# Patient Record
Sex: Female | Born: 1960 | Race: White | Marital: Married | Smoking: Former smoker
Health system: Southern US, Community
[De-identification: ages and names within clinical notes are randomized; demographics above are authoritative.]

## PROBLEM LIST (undated history)

## (undated) DIAGNOSIS — E785 Hyperlipidemia, unspecified: Secondary | ICD-10-CM

## (undated) DIAGNOSIS — G935 Compression of brain: Secondary | ICD-10-CM

## (undated) DIAGNOSIS — G4733 Obstructive sleep apnea (adult) (pediatric): Secondary | ICD-10-CM

## (undated) HISTORY — DX: Hyperlipidemia, unspecified: E78.5

## (undated) HISTORY — DX: Obstructive sleep apnea (adult) (pediatric): G47.33

## (undated) HISTORY — DX: Compression of brain: G93.5

---

## 2009-05-01 ENCOUNTER — Ambulatory Visit
Admit: 2009-05-01 | Disposition: A | Discharge status: Home / Self Care | Payer: Self-pay | Source: Ambulatory Visit | Admitting: Gynecologic Oncology

## 2009-05-01 LAB — COMPREHENSIVE METABOLIC PANEL
ALT: 13 U/L (ref 0–55)
AST (SGOT): 13 U/L (ref 5–34)
Albumin/Globulin Ratio: 1.1 (ref 0.9–2.2)
Albumin: 3.6 g/dL (ref 3.5–5.0)
Alkaline Phosphatase: 59 U/L (ref 40–150)
BUN: 14 mg/dL (ref 7.0–19.0)
Bilirubin, Total: 0.4 mg/dL (ref 0.2–1.2)
CO2: 17 mEq/L — ABNORMAL LOW (ref 22–29)
Calcium: 9.3 mg/dL (ref 8.5–10.5)
Chloride: 112 mEq/L — ABNORMAL HIGH (ref 98–107)
Creatinine: 0.7 mg/dL (ref 0.6–1.0)
Globulin: 3.3 g/dL (ref 2.0–3.6)
Glucose: 82 mg/dL (ref 70–100)
Potassium: 4.2 mEq/L (ref 3.5–5.1)
Protein, Total: 6.9 g/dL (ref 6.0–8.3)
Sodium: 141 mEq/L (ref 136–145)

## 2009-05-01 LAB — CBC
Hematocrit: 42.7 % (ref 37.0–47.0)
Hgb: 14 g/dL (ref 12.0–16.0)
MCH: 28.9 pg (ref 28.0–32.0)
MCHC: 32.8 g/dL (ref 32.0–36.0)
MCV: 88.2 fL (ref 80.0–100.0)
MPV: 11.4 fL (ref 9.4–12.3)
Platelets: 351 10*3/uL (ref 140–400)
RBC: 4.84 10*6/uL (ref 4.20–5.40)
RDW: 13 % (ref 12–15)
WBC: 8.59 10*3/uL (ref 3.50–10.80)

## 2009-05-01 LAB — PT AND APTT F/C
PT INR: 1 (ref 0.9–1.1)
PT: 13.9 s (ref 12.6–15.0)
PTT: 27 s (ref 23–37)

## 2009-05-01 LAB — TYPE AND SCREEN
AB Screen Gel: NEGATIVE
ABO Rh: B POS

## 2009-05-01 LAB — HEMOLYSIS INDEX: Hemolysis Index: 5

## 2009-05-01 LAB — GFR: EGFR: >60

## 2009-05-01 LAB — HCG QUANTITATIVE: hCG, Quant.: <1.2 m[IU]/mL (ref 0.0–4.9)

## 2009-05-02 ENCOUNTER — Ambulatory Visit: Payer: Self-pay

## 2009-05-02 ENCOUNTER — Ambulatory Visit (HOSPITAL_BASED_OUTPATIENT_CLINIC_OR_DEPARTMENT_OTHER)
Admission: RE | Admit: 2009-05-02 | Disposition: A | Discharge status: Home / Self Care | Payer: Self-pay | Source: Ambulatory Visit | Admitting: Gynecologic Oncology

## 2009-05-03 LAB — CBC
Hematocrit: 33.5 % — ABNORMAL LOW (ref 37.0–47.0)
Hgb: 11.2 g/dL — ABNORMAL LOW (ref 12.0–16.0)
MCH: 29.3 pg (ref 28.0–32.0)
MCHC: 33.4 g/dL (ref 32.0–36.0)
MCV: 87.7 fL (ref 80.0–100.0)
MPV: 10.8 fL (ref 9.4–12.3)
Platelets: 264 10*3/uL (ref 140–400)
RBC: 3.82 10*6/uL — ABNORMAL LOW (ref 4.20–5.40)
RDW: 14 % (ref 12–15)
WBC: 11.28 10*3/uL — ABNORMAL HIGH (ref 3.50–10.80)

## 2009-05-05 LAB — LAB USE ONLY - HISTORICAL SURGICAL PATHOLOGY

## 2009-05-07 LAB — LAB USE ONLY - HISTORICAL NON-GYN MEDICAL CYTOLOGY

## 2009-05-09 LAB — PREPARE RBC

## 2010-11-21 LAB — ECG 12-LEAD
Atrial Rate: 68 {beats}/min
P Axis: 42 degrees
P-R Interval: 126 ms
Q-T Interval: 372 ms
QRS Duration: 92 ms
QTC Calculation (Bezet): 395 ms
R Axis: 16 degrees
T Axis: 51 degrees
Ventricular Rate: 68 {beats}/min

## 2010-12-25 NOTE — Op Note (Unsigned)
Linda Savage, Linda Savage      MRN:          [id number]      Account:      [id number]      Document ID:  [id number]      Procedure Date: 05/02/2009            Admit Date: 05/02/2009            Patient Location: DISCHARGED 05/03/2009      Patient Type: A            SURGEON: Parke Poisson MD      ASSISTANT:                  PREOPERATIVE DIAGNOSIS:      Complex adnexal mass with elevated CA-125 in a perimenopausal patient.            POSTOPERATIVE DIAGNOSES:      Endometriosis with pelvic and abdominal adhesions with arcuate uterus and      bilateral normal ovaries and tubes.            TITLE OF PROCEDURE:      Exam under anesthesia, laparoscopic extensive lysis of adhesion, total      laparoscopic hysterectomy with bilateral salpingo-oophorectomy of over a      250-gram uterus.            ANESTHESIA:      General endotracheal.            INTRAVENOUS FLUIDS:      4000 mL.            URINE OUTPUT:      200 mL.            ESTIMATED BLOOD LOSS:      50 mL.            INDICATIONS:      Complex adnexal mass with elevated CA-125 in a perimenopausal patient.            FINDINGS:      Dense adhesions between the omentum and the anterior abdominal wall as well      as the uterus and the posterior cul-de-sac with extensive endometriosis and      adhesions between the adnexa and the pelvic sidewalls.  Adhesions between      the bladder and the anterior lower uterine segment as well with an arcuate      uterus.            DESCRIPTION OF PROCEDURE:      After noting appropriate consent and giving antibiotics, the patient was                                   Page 1 of 3      CAMIYA, VINAL      MRN:          [id number]      Account:      [id number]      Document ID:  [id number]      Procedure Date: 05/02/2009            taken to the operating room, placed in dorsal lithotomy position.  SCDs      were started prior to anesthesia administration.  Exam under anesthesia was      performed noting pelvic fullness.  She was then prepped and draped in  the  usual sterile fashion.  A Foley catheter and RUMI KOH uterine manipulator      was inserted in the usual manner.  This was done after the uterus was      sounded to 8 cm.  The attention was now turned to the abdomen where an      infraumbilical 5-mm incision was made with a scalpel and 5-mm Optiview with      camera were inserted.  Intraperitoneal access was confirmed and adhesions      were noted.  Now, under direct visualization, bilateral lower quadrant 5-mm      ports were inserted.  Now, the adhesions were taken down sharply as well as      with the cautery and LigaSure and bowel was noted to be intact at all      times, and hemostasis was achieved on the omentum.  Now, washings were      obtained and sent off for cytology.  Trendelenburg was obtained and bowel      was moved out of the pelvis, and above-mentioned findings were noted.  The      attention was first turned to the left side and the rectosigmoid was      dissected off the pelvic sidewall to gain more access to the left tube and      ovary.  The left round ligament was transected and the retroperitoneal      access was gained.  The left ureter was identified.  The left IP was      isolated, multiply cauterized and transected.  The vesicouterine peritoneum      was dissected and the bladder was dissected from the left side.  The      attention was turned to the right side, and the right round ligament was      transected.  The retroperitoneal access was gained.  The right ureter was      identified and the right IP was isolated, multiply cauterized, and      transected.  The vesicouterine peritoneum was dissected on the right side.      It was densely adherent.  The bladder was densely adherent and this was      taken sharply with the cautery, and the bladder was now completely      dissected off.  Bilateral uterine arteries were skeletonized, multiply      cauterized and transected.  It was noted that the uterus was densely      adherent in  the posterior cul-de-sac and these adhesions were taken sharply      while having good visualization of the rectosigmoid at all times and the      ureters.  This elevated the uterus a little bit and the cardinal ligaments      were sequentially cauterized and transected as well.  Colpotomy was      performed and specimen was removed from the vagina.  The vaginal cuff was      closed with multiple figure-of-eight sutures with 0 Vicryl stitch.  Now,      the attention was turned back to the abdomen and copious irrigation was      performed, and everything was noted to be hemostatic.  There was      yellowish-brownish material in the cul-de-sac that looked like old      endometriosis; however, rectal bubble test was confirmed along with an EEA      sizer protrusion into the rectum to confirm the  integrity of the rectum,      which was confirmed.  Now, once again, copious irrigation was performed and      all instruments removed.  The skin was closed in a subcuticular fashion      with 4-0 stitch.  There were no complications.  Marcaine 0.25% without      epinephrine was administered at all port sites.  As mentioned, all counts      were correct and there were no complications during the surgery, and I was      present and scrubbed through the entire procedure.  As the patient was                                   Page 2 of 3      MYRIKAL, MESSMER      MRN:          [id number]      Account:      [id number]      Document ID:  [id number]      Procedure Date: 05/02/2009            extubated, she had a live laryngospasm opposite and had to be reintubated      prior to taking to the recovery room and was taken to the recovery room in      an intubated fashion.  Please note that the intraoperative pathology was      benign.                        Electronic Signing Provider            D:  05/02/2009 13:02 PM by Dr. Parke Poisson, MD ([id number])      T:  05/02/2009 18:18 PM by [id number]                  [id number] Eben Burow MD                                    Page 3 of 3      Authenticated by Parke Poisson, MD On 05/07/2009 10:49:34 AM

## 2015-09-02 ENCOUNTER — Ambulatory Visit: Payer: BC Managed Care – PPO

## 2015-09-04 ENCOUNTER — Ambulatory Visit
Admission: RE | Admit: 2015-09-04 | Payer: BC Managed Care – PPO | Source: Ambulatory Visit | Admitting: Orthopaedic Surgery

## 2015-09-04 ENCOUNTER — Encounter: Admission: RE | Payer: Self-pay | Source: Ambulatory Visit

## 2015-09-04 SURGERY — REPAIR, HAND, NERVE
Anesthesia: General | Site: Hand | Laterality: Right

## 2016-06-30 ENCOUNTER — Ambulatory Visit
Admission: RE | Admit: 2016-06-30 | Discharge: 2016-06-30 | Disposition: A | Discharge status: Home / Self Care | Payer: BC Managed Care – PPO | Source: Ambulatory Visit | Attending: Cardiovascular Disease | Admitting: Cardiovascular Disease

## 2016-06-30 ENCOUNTER — Encounter (INDEPENDENT_AMBULATORY_CARE_PROVIDER_SITE_OTHER): Payer: Self-pay | Admitting: Cardiovascular Disease

## 2016-06-30 ENCOUNTER — Ambulatory Visit (INDEPENDENT_AMBULATORY_CARE_PROVIDER_SITE_OTHER): Payer: BC Managed Care – PPO | Admitting: Cardiovascular Disease

## 2016-06-30 VITALS — BP 120/89 | HR 128 | Ht 63.0 in | Wt 184.0 lb

## 2016-06-30 DIAGNOSIS — R0609 Other forms of dyspnea: Secondary | ICD-10-CM

## 2016-06-30 DIAGNOSIS — R000 Tachycardia, unspecified: Secondary | ICD-10-CM

## 2016-06-30 LAB — T4, FREE: T4 Free: 0.97 ng/dL (ref 0.70–1.48)

## 2016-06-30 LAB — TSH: TSH: 1.39 u[IU]/mL (ref 0.35–4.94)

## 2016-06-30 NOTE — Progress Notes (Signed)
Cardiology Consult note  Cardiology Consult    Date/Time:  06/30/2016 11:06 AM  Patient Name:  Linda Savage  DoB:  1960-03-22  MRN:  [id number]  Room#: Room/bed info not found    Chief Complaint:    Chief Complaint   Patient presents with   . Initial Consult   . Tachycardia       Reason For Consult:     tachycardia    HPI :   56 yo with tachycardia. Fitbit reads high HR. Can be as high as 120. On going for 12 months.  Can feel jittery. Rare fluttering. SOB climbing stairs Is no worse lately. No chest pain. No edema. No syncope.   Gained weight over several years. No sweats or flushing. Drinks one cup of coffee daily.    PMHx:     Past Medical History:   Diagnosis Date   . Chiari malformation type I    . Hyperlipidemia    . OSA (obstructive sleep apnea)        Allergies:     Allergies   Allergen Reactions   . Amoxicillin-Pot Clavulanate Nausea And Vomiting   . Tramadol Itching       Home Medications:     No current outpatient prescriptions on file prior to visit.     No current facility-administered medications on file prior to visit.          SoHx:     Social History   Substance Use Topics   . Smoking status: Former Smoker     Quit date: 04/11/2008   . Smokeless tobacco: Never Used   . Alcohol use Yes      Comment: socially 2 times a week       Surgical Hx:     Past Surgical History:   Procedure Laterality Date   . CESAREAN SECTION  1985   . FINGER SURGERY  2017   . HYSTERECTOMY  2011       Family Hx:     Dad died of complications in surgery  No premature CAD in first degree relatives    Review of Systems:   Constitutional: Negative for fevers and chills  Skin: No rash or lesions  Respiratory: Negative for hemoptysis  Cardiovascular: as per HPI  Gastrointestinal: Negative for hematochezia  Musculoskeletal:  No myalgias  Genitourinary: Negative for hematuria  Neurologic: headaches  Otherwise 10 point review of systems is negative.      Physical Exam:   BP 120/89 (BP Site: Left arm, Patient Position: Sitting, Cuff Size:  Large)   Pulse (!) 128   Ht 1.6 m (5\' 3" )   Wt 83.5 kg (184 lb)   BMI 32.59 kg/m     SpO2 on              Constitutional:      Alert, cooperative, well developed, well nourished.  No acute distress.   Skin:     Warm and dry to touch.  No apparent rashes or bruising.   Head/Eyes :    Normocephalic.  EOM intact.  Normal conjunctivae and lids.       Neck:   Carotid pulses full and equal bilaterally.  No carotid bruit. JVP to 5 cm.   Lungs:     No tenderness to palpation.  Normal respiratory effort.  Clear to auscultation bilaterally.  No rhonchi, rales, or wheezes.     Cardiac:   LV apical impulse not displaced.  Regular rhythm.   S1 and  S2 normal.  No murmur, rub, or gallop   Abdomen:     Soft, non-tender.  Bowel sounds present.  No bruits.   Extremities:   No clubbing, cyanosis, or edema.   Pulses:   Distal pulses are full and equal bilaterally.     Neurologic:   No gross motor or sensory deficits.  Alert O x 3.   Psychiatric:   Normal mood and affect.     Labs:     No visits with results within 2 Day(s) from this visit.   Latest known visit with results is:   Hospital Outpatient Visit on 05/02/2009   Component Date Value Ref Range Status   . ABO Rh 05/01/2009 B POS   Final   . AB Screen Gel 05/01/2009 NEG   Final   . RBC Leukoreduced 05/01/2009 see text   Final   . WBC 05/03/2009 11.28* 3.50 - 10.80 x10 3/uL Final   . RBC 05/03/2009 3.82* 4.20 - 5.40 x10 6/uL Final   . Hgb 05/03/2009 11.2* 12.0 - 16.0 g/dL Final   . Hematocrit 16/12/9602 33.5* 37.0 - 47.0 % Final   . MCV 05/03/2009 87.7  80.0 - 100.0 fL Final   . MCH 05/03/2009 29.3  28.0 - 32.0 pg Final   . MCHC 05/03/2009 33.4  32.0 - 36.0 g/dL Final   . RDW 54/11/8117 14  12 - 15 % Final   . MPV 05/03/2009 10.8  9.4 - 12.3 fL Final   . Platelets 05/03/2009 264  140 - 400 x10 3/uL Final       Radiology:     ECG independently reviewed by me shows dinud tach at 115 bpm    Assessment/Plan:     [name] is a 56 y.o. female who presents for cardiac  evaluation of tachycardia. ECG here today shows sinus tachycardia. Will have her wear a holter monitor to evaluate for arrhythmias and HR variability. Check TSH and metanephrines. Given dyspnea, will also obtain an echocardiogram. Tachycardia may also be contributing to dyspnea with exertion.    Thank you for the consultation. Please feel free to contact me with any questions that may arise regarding this visit.

## 2016-07-03 LAB — METANEPHRINES, FRACTIONATED, FREE, PL
Metanephrine, Free: 48 pg/mL (ref <=57)
Normetanephrine, Free: 104 pg/mL (ref <=148)
Total, Free (MN + NMN): 152 pg/mL (ref <=205)

## 2016-07-05 ENCOUNTER — Encounter (INDEPENDENT_AMBULATORY_CARE_PROVIDER_SITE_OTHER): Payer: Self-pay

## 2016-07-07 ENCOUNTER — Ambulatory Visit (INDEPENDENT_AMBULATORY_CARE_PROVIDER_SITE_OTHER): Payer: BC Managed Care – PPO

## 2016-07-07 DIAGNOSIS — R0609 Other forms of dyspnea: Secondary | ICD-10-CM

## 2016-07-07 DIAGNOSIS — R000 Tachycardia, unspecified: Secondary | ICD-10-CM

## 2016-07-12 ENCOUNTER — Ambulatory Visit (INDEPENDENT_AMBULATORY_CARE_PROVIDER_SITE_OTHER): Payer: BC Managed Care – PPO | Admitting: Cardiovascular Disease

## 2016-07-12 DIAGNOSIS — R001 Bradycardia, unspecified: Secondary | ICD-10-CM

## 2016-07-12 DIAGNOSIS — R000 Tachycardia, unspecified: Secondary | ICD-10-CM

## 2016-07-12 NOTE — Procedures (Signed)
HOLTER REPORT    [facility] - [facility]  Tel [phone]      PATIENT:  Linda Savage         MRN: [id number].  Gender: female.  DOB: 14-Jan-1961.  Age: 56 y.o.  Date of study:  06/30/2016    Ordering Physician:  Leamon Arnt, MD  Primary Physician:  Estella Husk, DO  Primary Cardiologist:  Leamon Arnt, MD    INDICATION:    1. Sinus tachycardia          DATA:  Test of hookup:  06/30/2016  Recording time:  23 hours  Quality:  good   Tech comments:       Heart Rate Data     Total beats: 140088   Min HR: 65   Avg HR: 97   Max HR: 162     Pauses > 2.5 sec: 0        Longest: 0 sec  Ventricular Ectopy     Total VE beats: 0 (0.1%)   Vent runs: 0 events          Longest: 0 beats          Fastest: 0 bpm   Triplets: 0 events   Couplets: 0 events   Supraventricular Ectopy     Total SVE beats: 3 (0.1%)   Atrial runs: 0 events          Longest: 0 beats          Fastest: 0 bpm   Atrial pairs: 0 events     FINDINGS:  Baseline rhythm:  Sinus rhythm    Arrhythmia:  Sinus rhythm and sinus tachycardia with rare PVCs    Symptoms reported:  none  Symptom correlation with arrhythmia:  n/a    IMPRESSION:  Sinus rhythm and sinus tachycardia with rare PVCs      Interpreted and electronically signed by:  Dr. Marcia Brash [facility], [facility] - [place] - Norberto Sorenson - Faythe Dingwall

## 2016-07-13 ENCOUNTER — Encounter (INDEPENDENT_AMBULATORY_CARE_PROVIDER_SITE_OTHER): Payer: Self-pay | Admitting: Cardiovascular Disease

## 2016-07-14 ENCOUNTER — Telehealth (INDEPENDENT_AMBULATORY_CARE_PROVIDER_SITE_OTHER): Payer: Self-pay | Admitting: Internal Medicine

## 2016-07-15 ENCOUNTER — Encounter (INDEPENDENT_AMBULATORY_CARE_PROVIDER_SITE_OTHER): Payer: Self-pay | Admitting: Cardiovascular Disease

## 2016-07-27 ENCOUNTER — Encounter (INDEPENDENT_AMBULATORY_CARE_PROVIDER_SITE_OTHER): Payer: Self-pay | Admitting: Cardiovascular Disease

## 2016-07-27 ENCOUNTER — Ambulatory Visit (INDEPENDENT_AMBULATORY_CARE_PROVIDER_SITE_OTHER): Payer: BC Managed Care – PPO | Admitting: Cardiovascular Disease

## 2016-07-27 VITALS — BP 139/94 | HR 72 | Ht 63.0 in | Wt 180.0 lb

## 2016-07-27 DIAGNOSIS — R000 Tachycardia, unspecified: Secondary | ICD-10-CM

## 2016-07-27 DIAGNOSIS — E785 Hyperlipidemia, unspecified: Secondary | ICD-10-CM

## 2016-07-27 MED ORDER — ROSUVASTATIN CALCIUM 5 MG PO TABS
5.0000 mg | ORAL_TABLET | Freq: Every day | ORAL | 1 refills | Status: DC
Start: 2016-07-27 — End: 2017-05-23

## 2016-07-27 MED ORDER — OMEPRAZOLE 20 MG PO CPDR
20.0000 mg | DELAYED_RELEASE_CAPSULE | Freq: Every day | ORAL | 1 refills | Status: AC
Start: 2016-07-27 — End: ?

## 2016-07-27 NOTE — Progress Notes (Signed)
[facility] - [facility]     Chief Complaint   Patient presents with   . Tachycardia     here to discuss recent echo, holter, and lab work.          HPI:     Linda Savage 56 y.o. female presents to the office for a follow up evaluation of tachycardia  Rare palpitations for no more than a few seconds. Feels jittery and HR can be elevated.    Past Medical History     Past Medical History:   Diagnosis Date   . Chiari malformation type I    . Hyperlipidemia    . OSA (obstructive sleep apnea)        Past Surgical History     Past Surgical History:   Procedure Laterality Date   . CESAREAN SECTION  1985   . FINGER SURGERY  2017   . HYSTERECTOMY  2011       Family History     History reviewed. No pertinent family history.    Social History     Social History     Social History   . Marital status: Married     Spouse name: N/A   . Number of children: N/A   . Years of education: N/A     Occupational History   . Not on file.     Social History Main Topics   . Smoking status: Former Smoker     Quit date: 04/11/2008   . Smokeless tobacco: Never Used   . Alcohol use Yes      Comment: socially 2 times a week   . Drug use: No   . Sexual activity: Not on file     Other Topics Concern   . Not on file     Social History Narrative   . No narrative on file       Allergies     Allergies   Allergen Reactions   . Amoxicillin-Pot Clavulanate Nausea And Vomiting   . Tramadol Itching       Medications     Current Outpatient Prescriptions on File Prior to Visit   Medication Sig Dispense Refill   . diphenhydrAMINE (BENADRYL) 25 mg capsule Take 25 mg by mouth nightly.     Marland Kitchen HYDROcodone-acetaminophen (NORCO 10-325) 10-325 MG per tablet Take 1 tablet by mouth as needed.     Marland Kitchen omeprazole (PRILOSEC) 20 MG capsule Take 20 mg by mouth daily.     . rosuvastatin (CRESTOR) 5 MG tablet Take 5 mg by mouth daily.       No current facility-administered medications on file prior to visit.        Review of Systems     Constitutional: Negative for fevers  and chills  Respiratory: Negative for hemoptysis  Cardiovascular: as per HPI  Gastrointestinal: Negative for hematochezia    Physical Exam     Vitals:    07/27/16 1144   BP: (!) 139/94   Pulse: 72     BP 120/80    Body mass index is 31.89 kg/m.    General:  Patient appears their stated age, well-nourished.  Alert and in no apparent distress.  Eyes: No conjunctivitis, no purulent discharge, no lid lag  ENT:  Hearing grossly intact, Nares patent bilaterally, Lips moist, color appropriate for race.  Respiratory: Clear to auscultation. Respiratory effort unlabored, chest expansion symmetric.    Neck: brisk carotid upstrokes, no carotid bruits, JVP to 5 cm  Cardio:  regular rate and rhythm. s1 and s2, no gallops.  Extremities warm, pulses 2+, no peripheral edema  Psychiatric: Good insight and judgment, oriented to person, place, and time    Labs     CBC:     CMP:     Lipid Panel No results found for: CHOL, TRIG, HDL      EKG     Not performed    Assessment and Plan     Linda Savage is a 56 y.o. female who presents for ongoing evaluation of tachycardia. Holter monitor with sinus rhythm and sinus tachycardia without significant arrhythmias. TSH and metanephrines normal. Echocardiogram with normal LV function. Consider beta blocker if worsening symptoms or tachycardia.    Please feel free to contact me should any questions arise regarding this visit.

## 2017-05-14 IMAGING — MG MAMMOGRAPHY SCREENING BILATERAL 3D TOMOSYNTHESIS WITH CAD
8 series · 8 of 24 positions shown · non-contrast
Comparison: 02/25/2016. 
BREAST DENSITY: (Level A) The breasts are almost entirely fatty.

MAMMOGRAPHY SCREENING BILATERAL 3D TOMOSYNTHESIS WITH CAD, 05/14/2017 [DATE]: 
CLINICAL INDICATION: Screening.
TECHNIQUE: Digital bilateral mammograms and 3-D Tomosynthesis were obtained. 
These were interpreted both primarily and with the aid of computer-aided 
detection system.

[R CC]
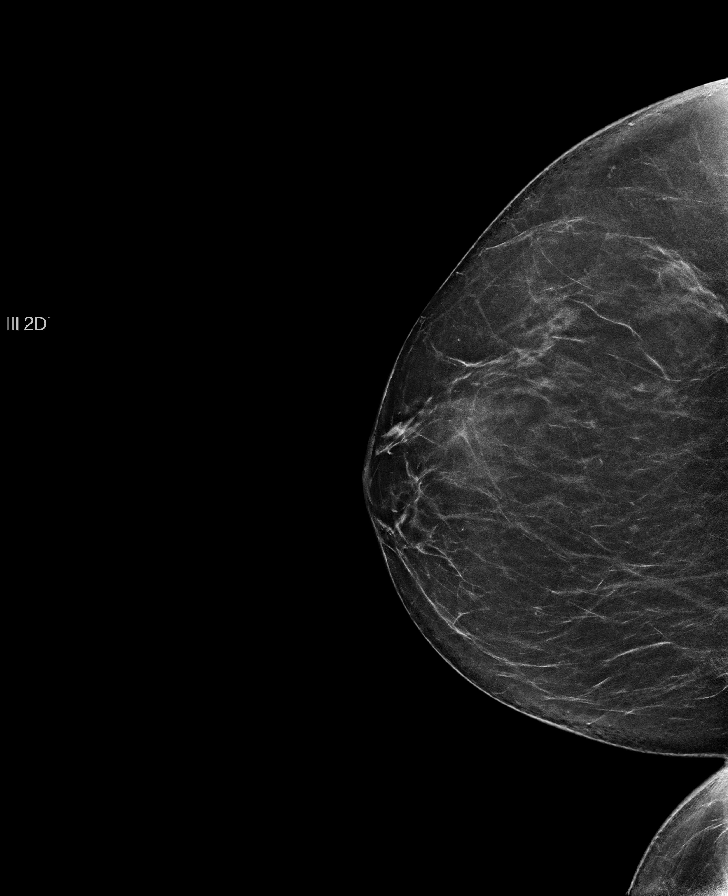

[L CC]
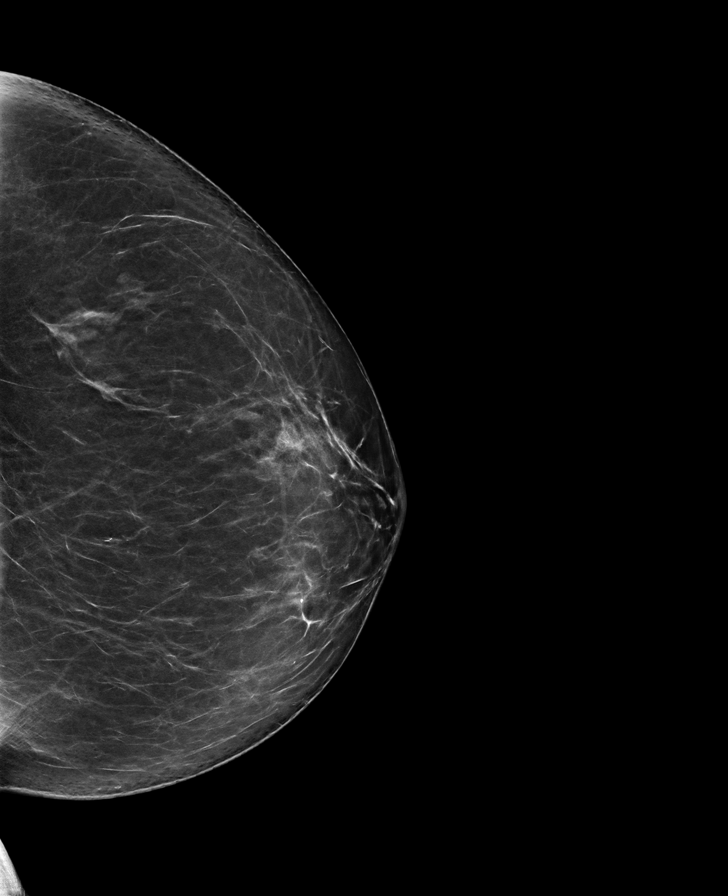

[L MLO]
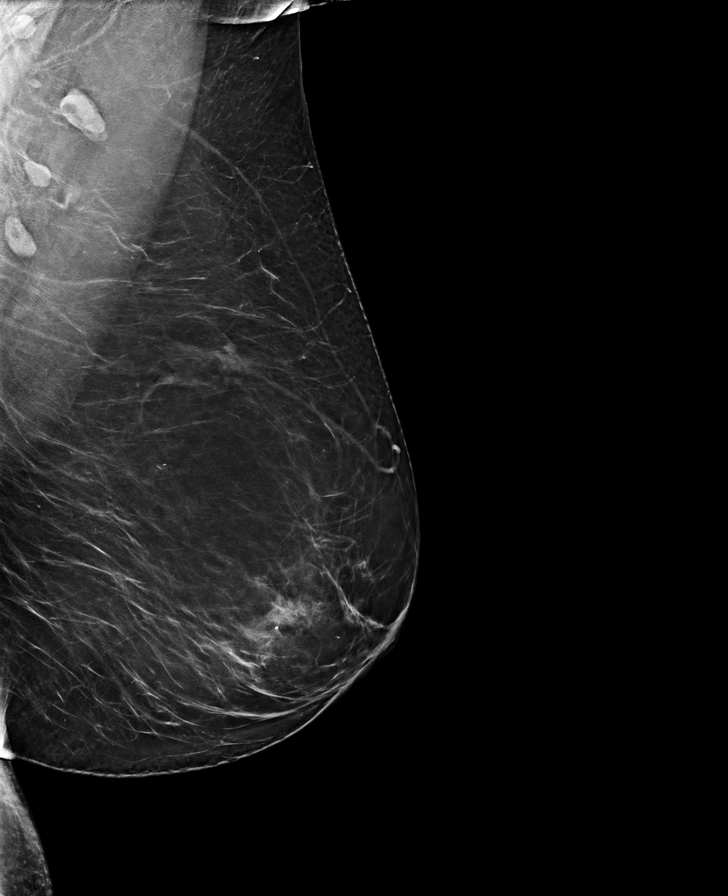

[R MLO]
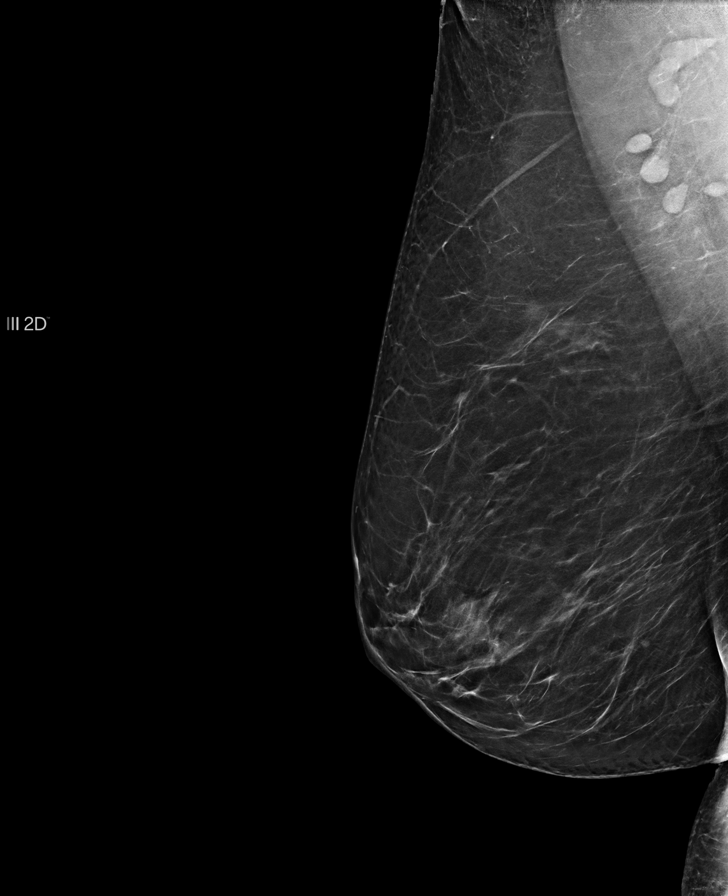

[L MLO tomo · tomo slice 43/86.0]
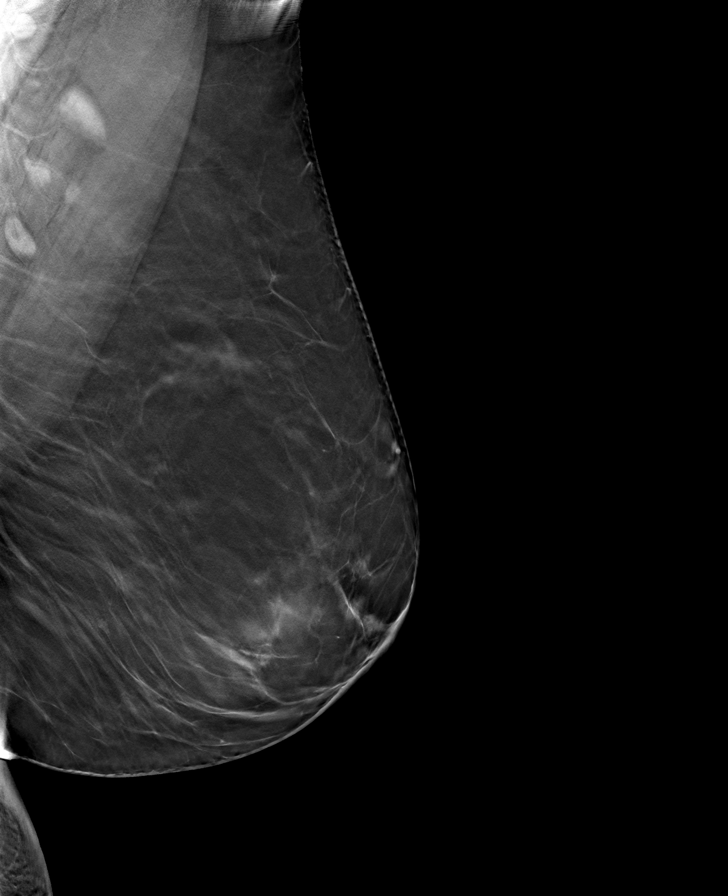

[L CC tomo · tomo slice 35/69.0]
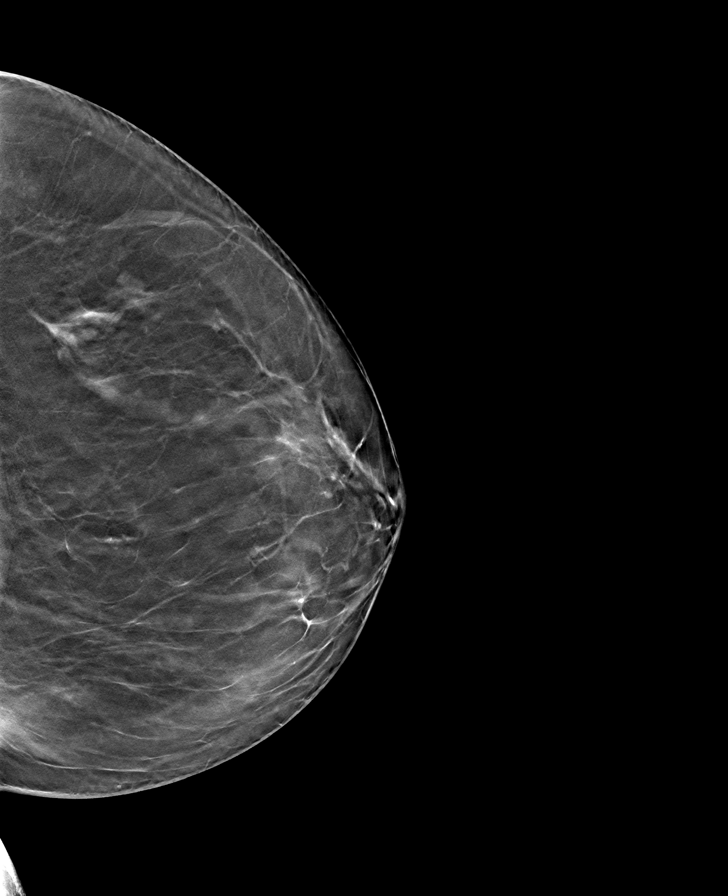

[R MLO tomo · tomo slice 41/81.0]
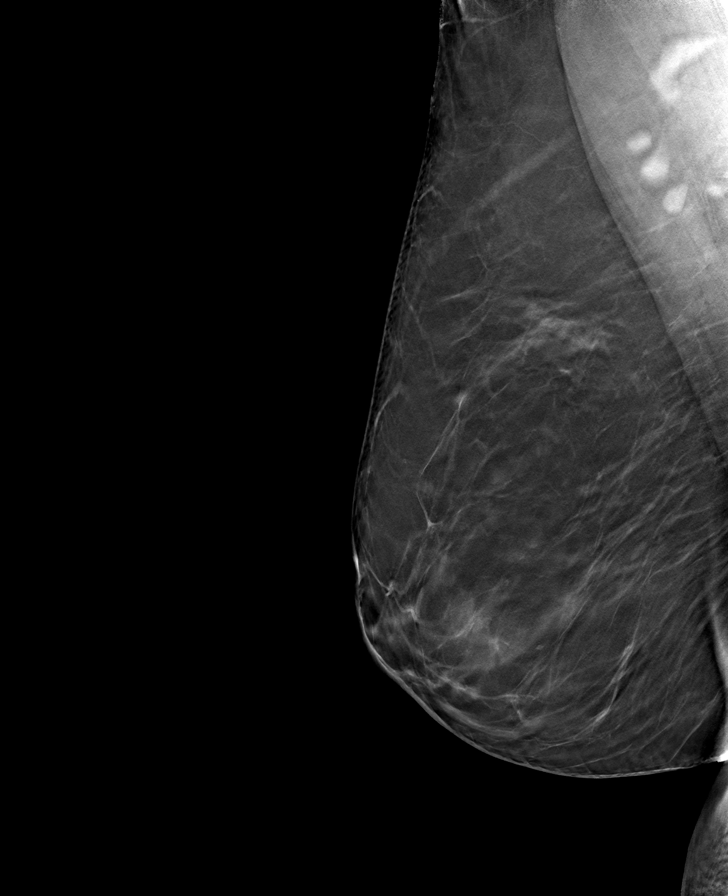

[R CC tomo · tomo slice 35/70.0]
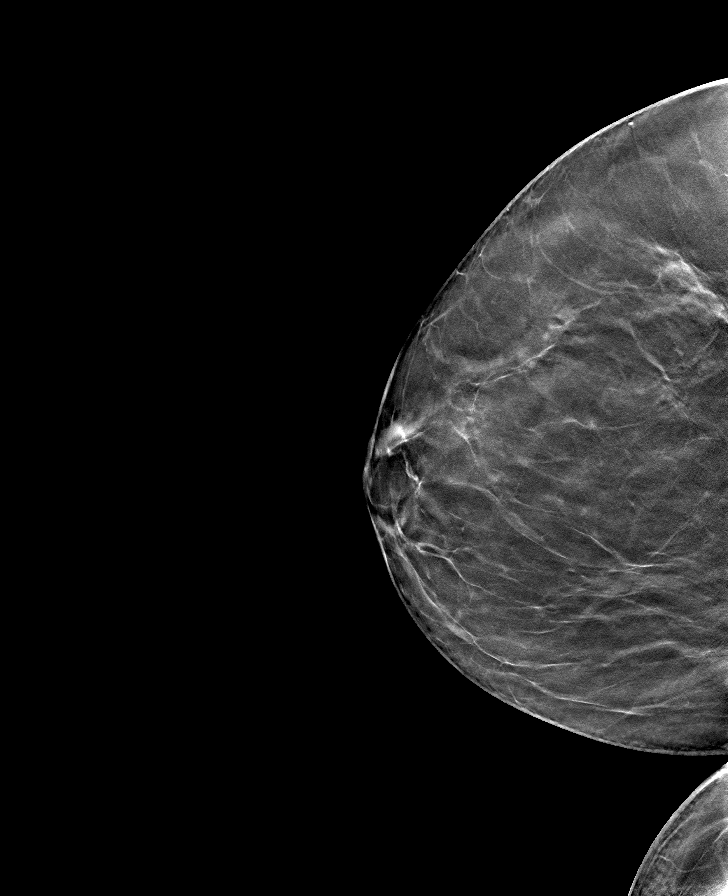

[8 of 24 positions shown; findings below may reference images not displayed]

FINDINGS: No mammographically suspicious abnormality and no significant 
change.
IMPRESSION: ( BI-RADS 2) Benign findings. Routine mammographic follow-up is recommended.

## 2017-05-23 ENCOUNTER — Other Ambulatory Visit (INDEPENDENT_AMBULATORY_CARE_PROVIDER_SITE_OTHER): Payer: Self-pay | Admitting: Cardiovascular Disease

## 2017-05-23 DIAGNOSIS — E785 Hyperlipidemia, unspecified: Secondary | ICD-10-CM

## 2017-08-27 ENCOUNTER — Other Ambulatory Visit (INDEPENDENT_AMBULATORY_CARE_PROVIDER_SITE_OTHER): Payer: Self-pay | Admitting: Cardiovascular Disease

## 2017-08-27 DIAGNOSIS — E785 Hyperlipidemia, unspecified: Secondary | ICD-10-CM

## 2018-03-18 ENCOUNTER — Other Ambulatory Visit (INDEPENDENT_AMBULATORY_CARE_PROVIDER_SITE_OTHER): Payer: Self-pay | Admitting: Cardiovascular Disease

## 2018-03-18 DIAGNOSIS — E785 Hyperlipidemia, unspecified: Secondary | ICD-10-CM

## 2018-08-17 IMAGING — MG MAMMOGRAPHY SCREENING BILATERAL 3D TOMOSYNTHESIS WITH CAD
8 series · 8 of 24 positions shown · non-contrast
Comparison: 05/14/2017 and 02/25/2016. 
BREAST DENSITY: (Level A) The breasts are almost entirely fatty.

MAMMOGRAPHY SCREENING BILATERAL 3D TOMOSYNTHESIS WITH CAD, 08/17/2018 [DATE]: 
CLINICAL INDICATION: Screening.
TECHNIQUE: Digital bilateral mammograms and 3-D Tomosynthesis were obtained. 
These were interpreted both primarily and with the aid of computer-aided 
detection system.

[R CC]
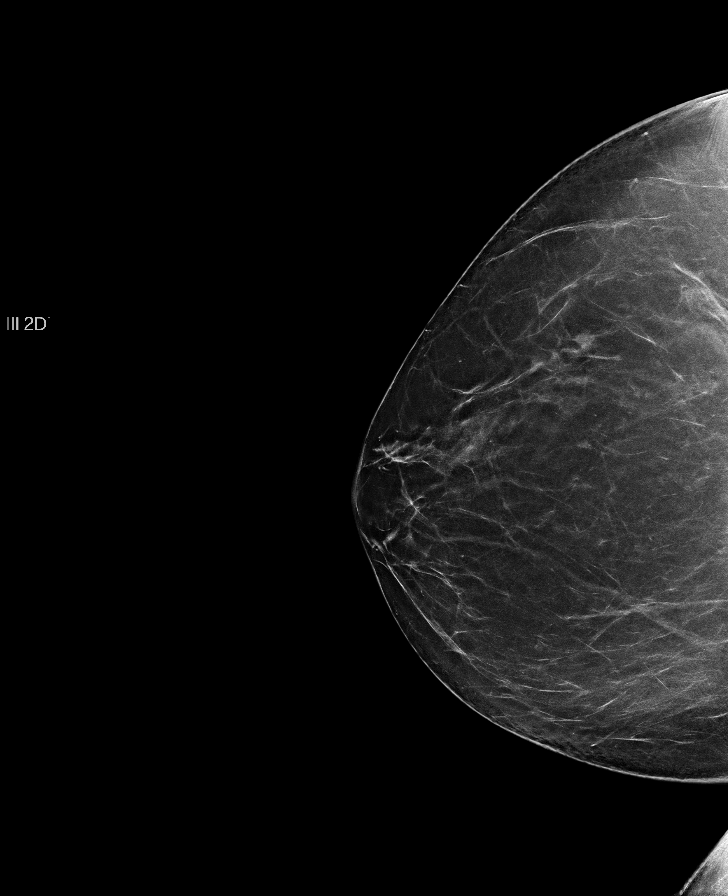

[L CC]
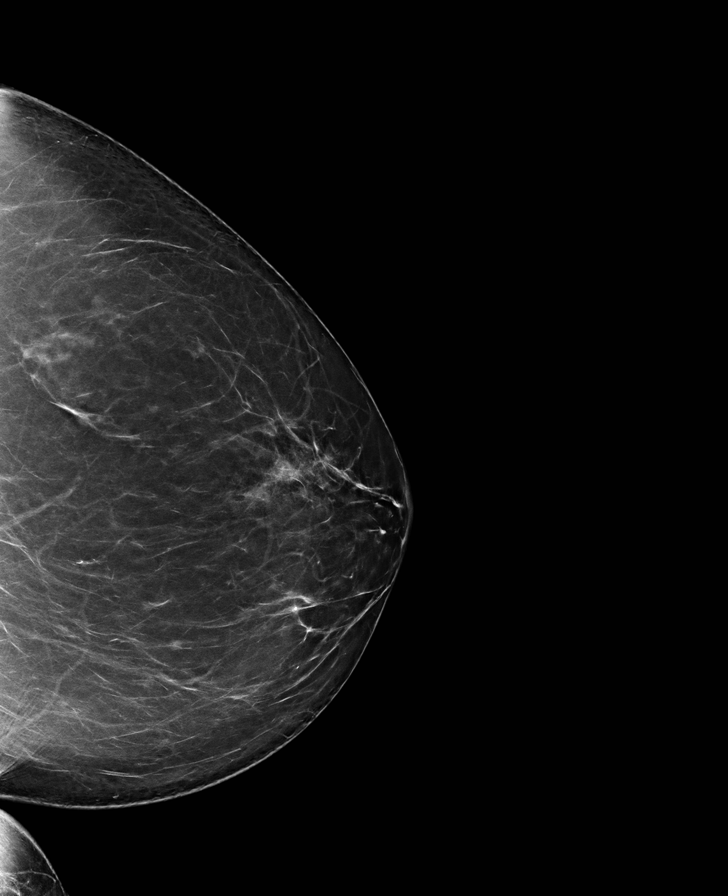

[L MLO]
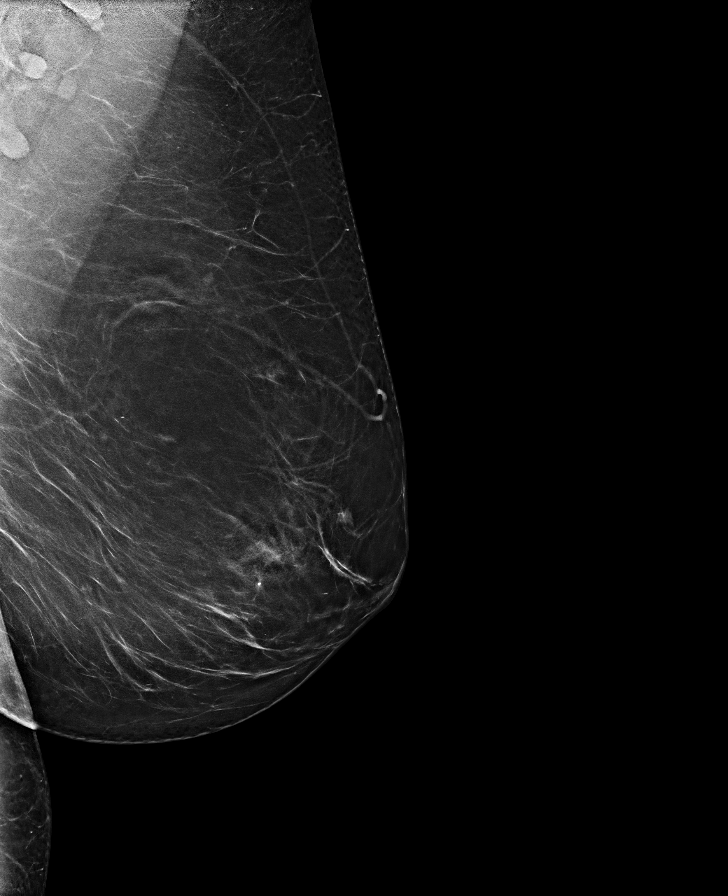

[R MLO]
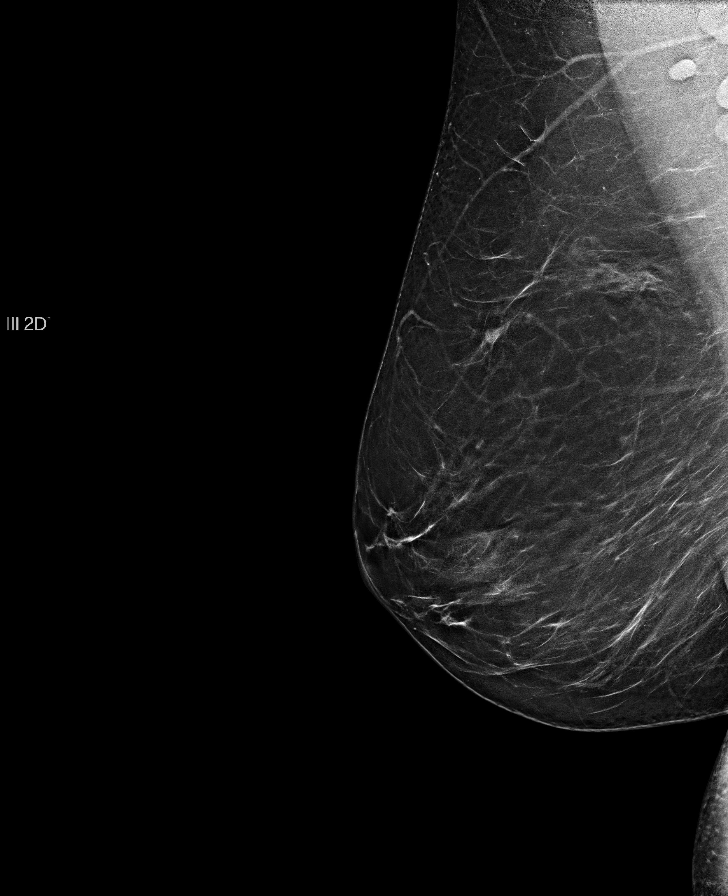

[L MLO tomo · tomo slice 46/91.0]
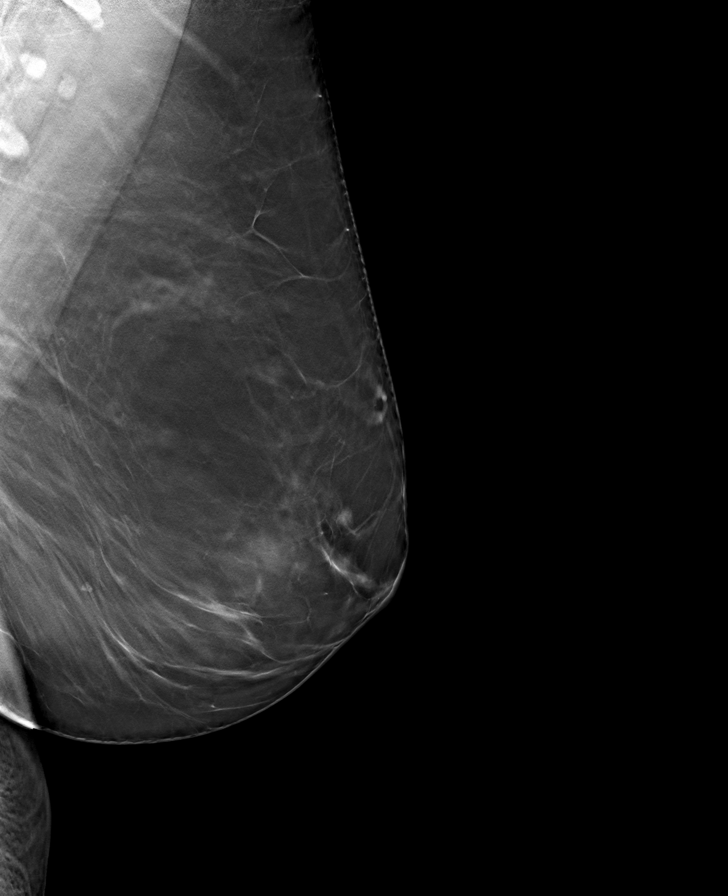

[R MLO tomo · tomo slice 43/85.0]
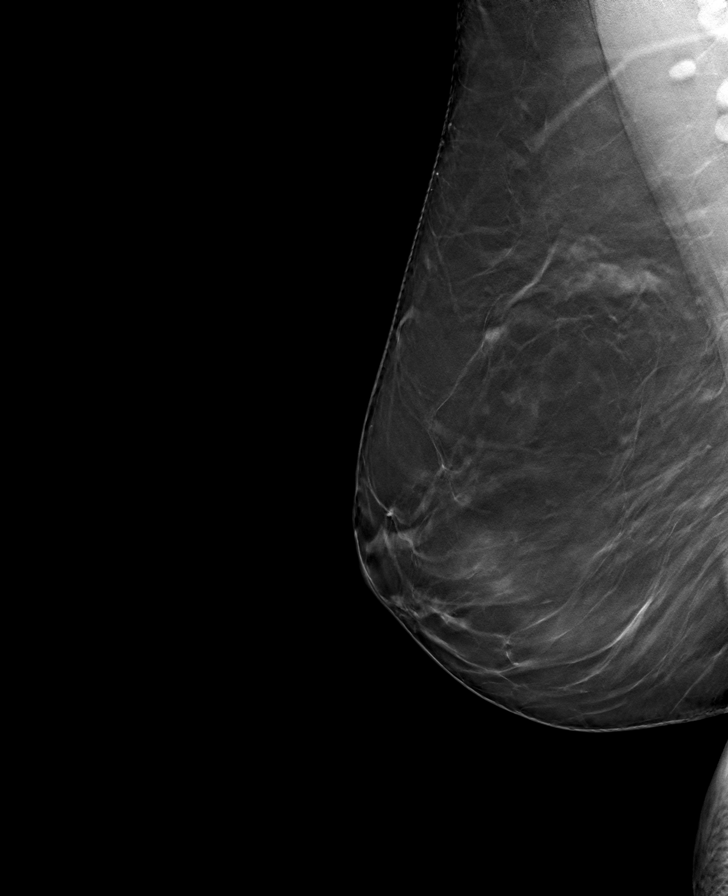

[L CC tomo · tomo slice 41/81.0]
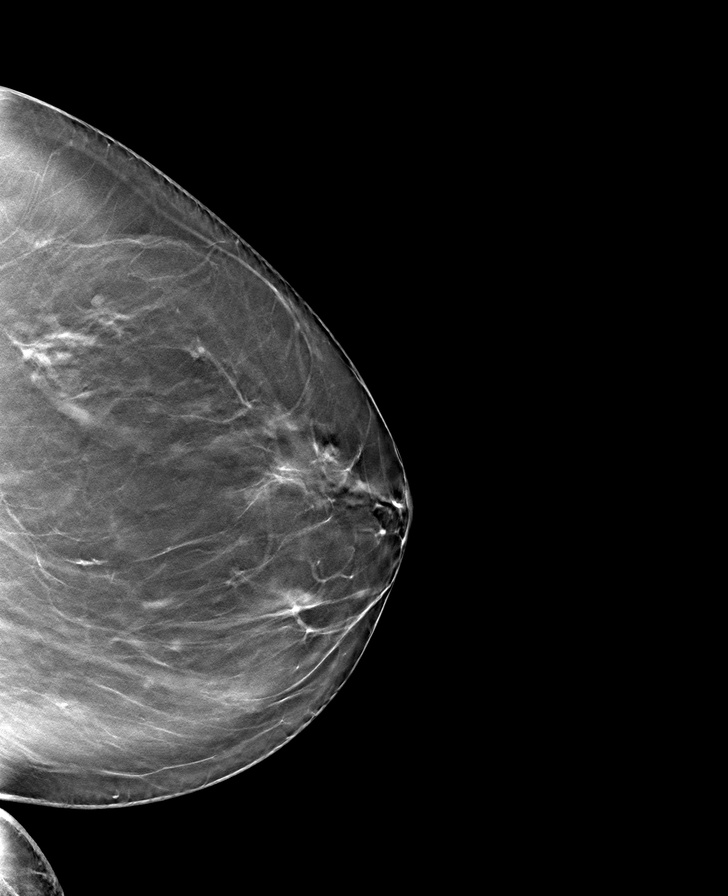

[R CC tomo · tomo slice 39/77.0]
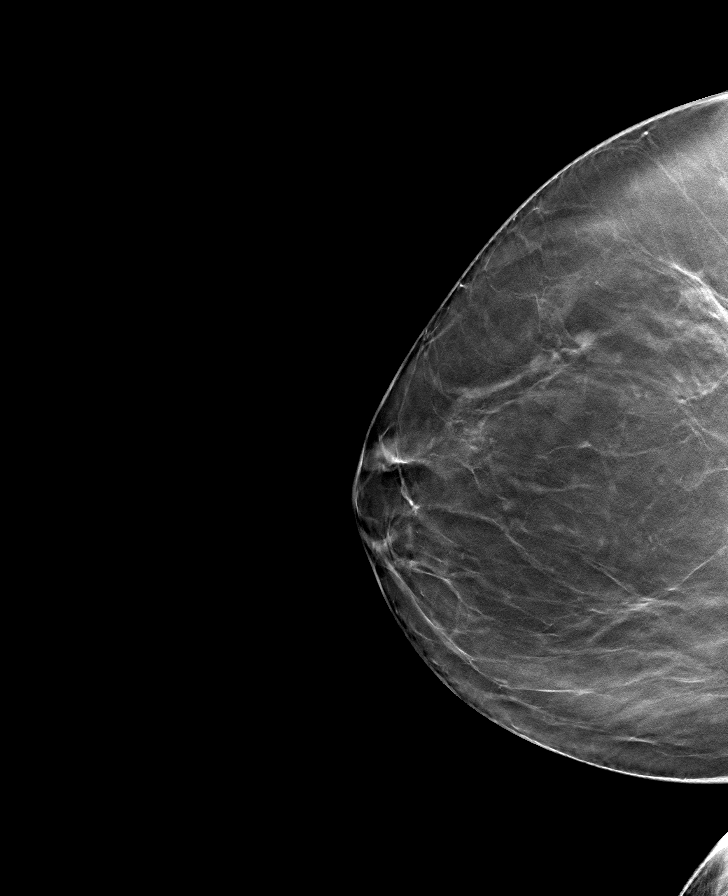

[8 of 24 positions shown; findings below may reference images not displayed]

FINDINGS: No mammographically suspicious abnormality and no significant change.
IMPRESSION: ( BI-RADS 1) Negative mammogram. Routine mammographic follow-up is recommended.

## 2018-08-30 ENCOUNTER — Other Ambulatory Visit (INDEPENDENT_AMBULATORY_CARE_PROVIDER_SITE_OTHER): Payer: Self-pay | Admitting: Cardiovascular Disease

## 2018-08-30 DIAGNOSIS — E785 Hyperlipidemia, unspecified: Secondary | ICD-10-CM

## 2018-11-15 IMAGING — MR MRI BRAIN W/WO CONTRAST
4 of 12 series · 17 of 48 positions shown · IV contrast (gadavist)
Comparison: Cranial CT November 15, 2018

SUIT A 
MRI BRAIN W/WO CONTRAST,11/15/2018 [DATE]: 
CLINICAL INDICATION: Occipital headache, evaluate for Chiari malformation
TECHNIQUE: Axial T1, Axial T2, Axial FLAIR, Diffusion weighted images, Sagittal 
T1, Enhanced Axial T1, and Enhanced coronal fat-suppressed T1 were obtained. 5 
cc's of gadavist was injected intravenously by hand.

[Series 201: T2 · sagittal · 4.0mm · 0.48mm/px · 2 of 27 slices shown]
[im 1/27]
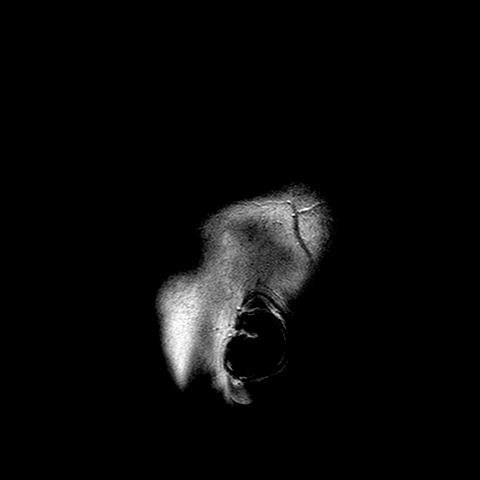
[im 27/27]
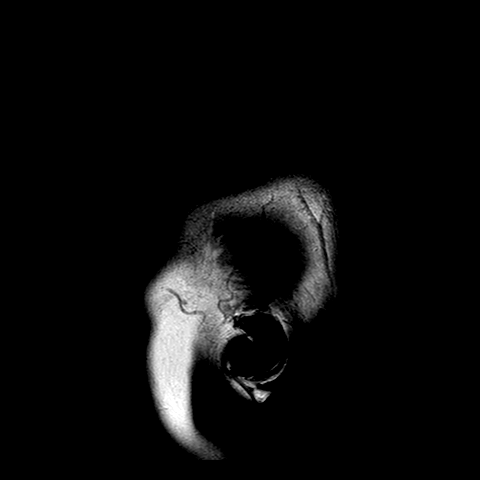

[Series 601: SWI · axial · 3.0mm · 0.40mm/px · z∈[-44,+88]mm · 9 of 200 slices shown]
[im 11/200]
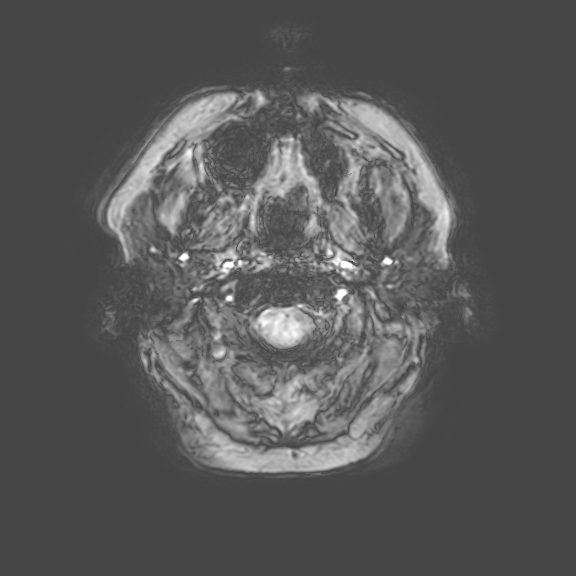
[im 32/200]
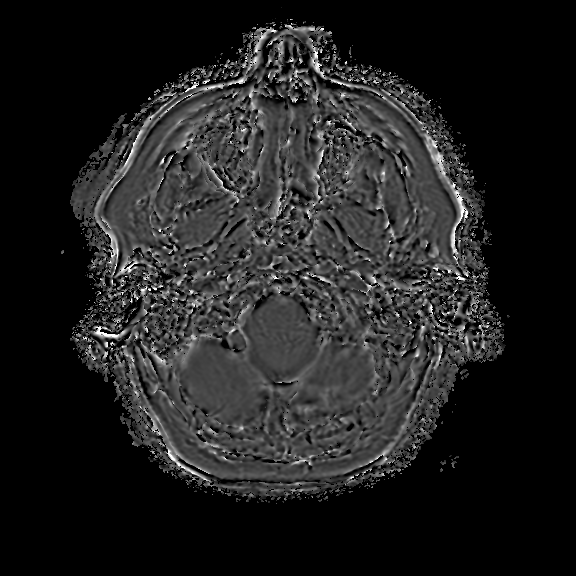
[im 63/200]
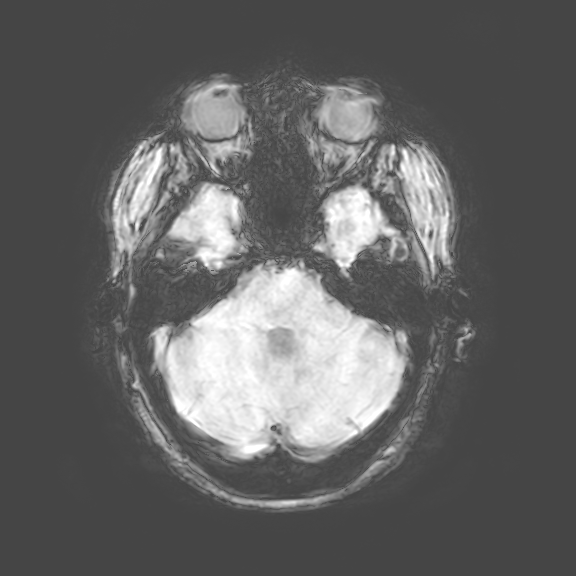
[im 84/200]
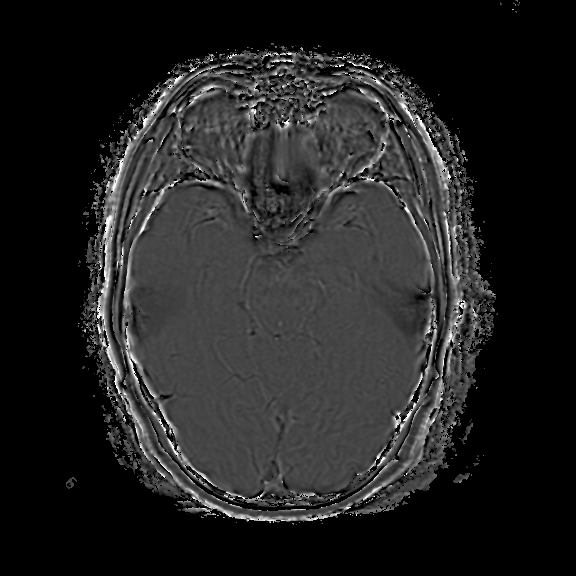
[im 105/200]
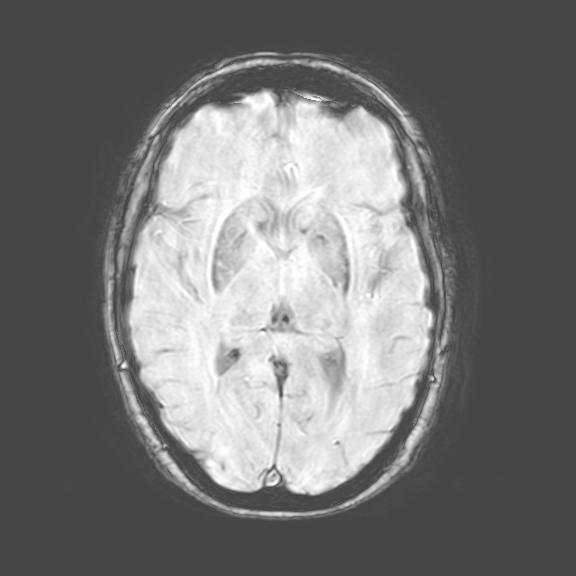
[im 116/200]
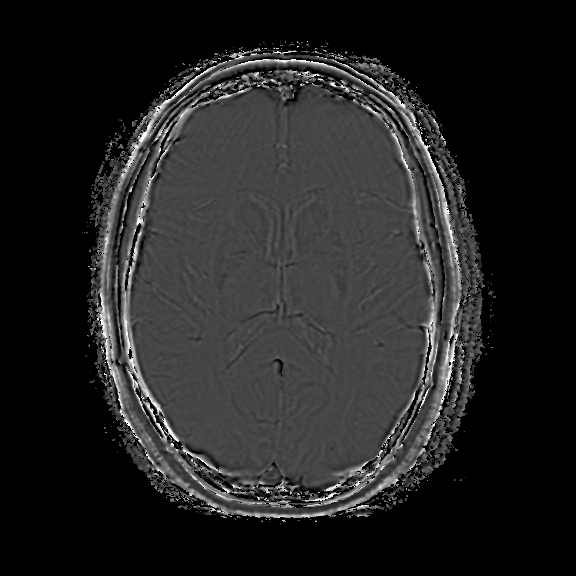
[im 137/200]
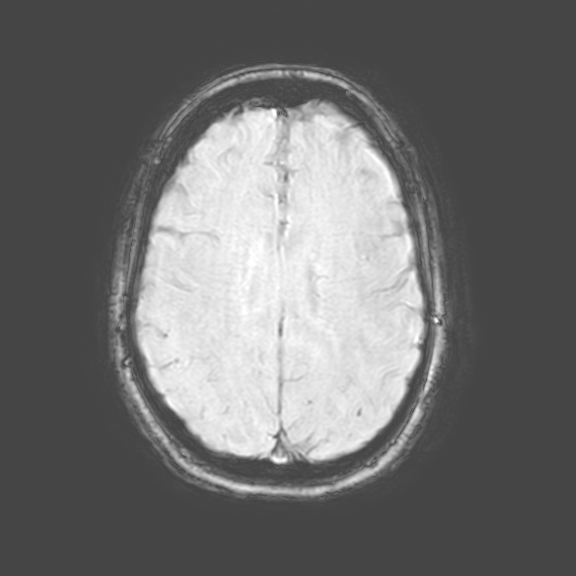
[im 168/200]
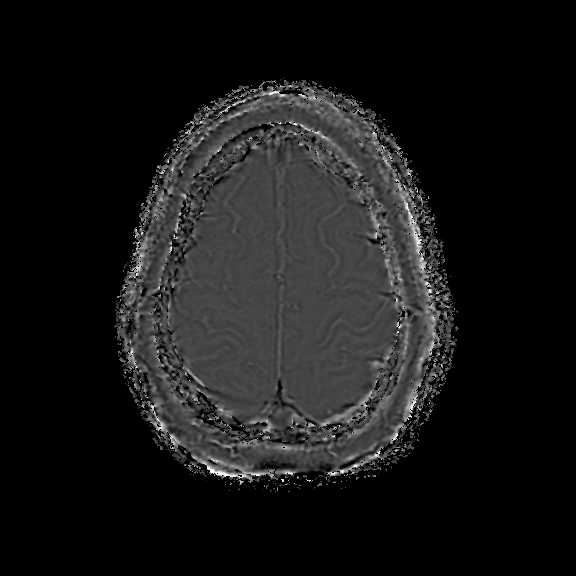
[im 189/200]
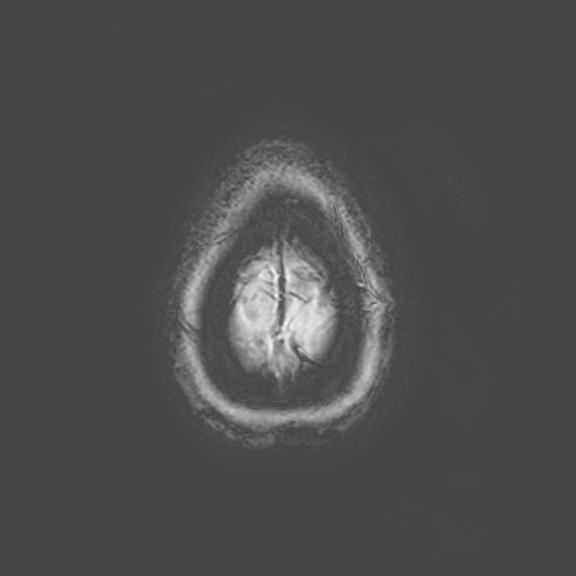

[Series 901: T1 post-contrast · axial · 5.0mm · 0.43mm/px · z∈[-53,+102]mm · 3 of 27 slices shown (1 of 2)]
[im 1/27]
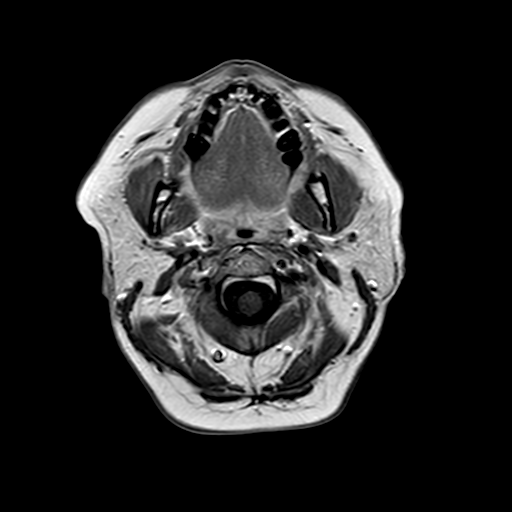
[im 14/27]
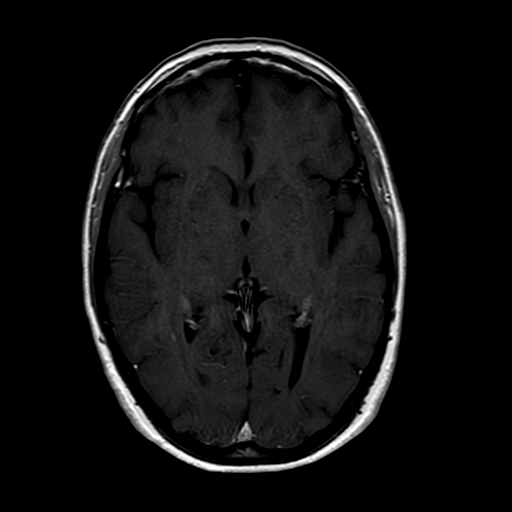
[im 27/27]
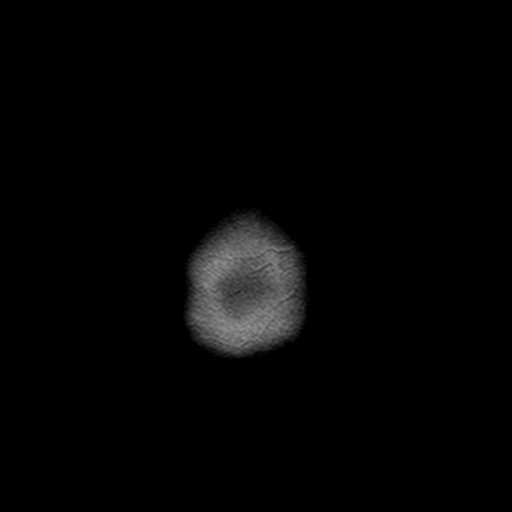

[Series 1001: T1 post-contrast · coronal · 4.0mm · 0.41mm/px · 3 of 34 slices shown (2 of 2)]
[im 1/34]
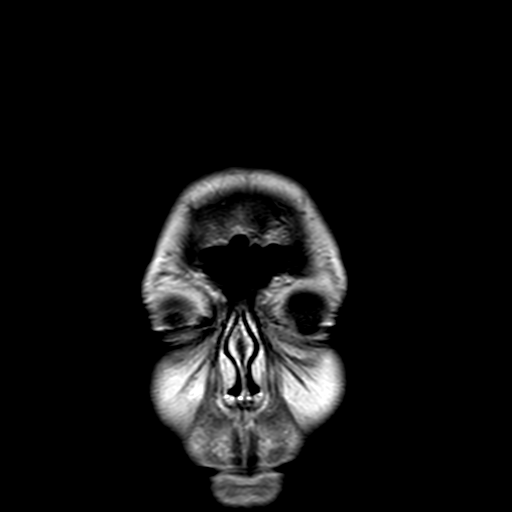
[im 17/34]
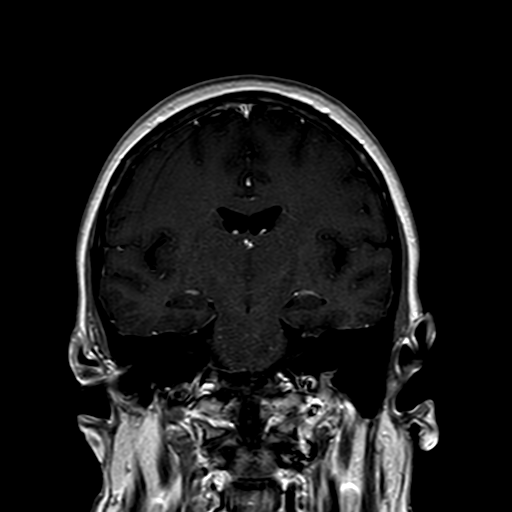
[im 34/34]
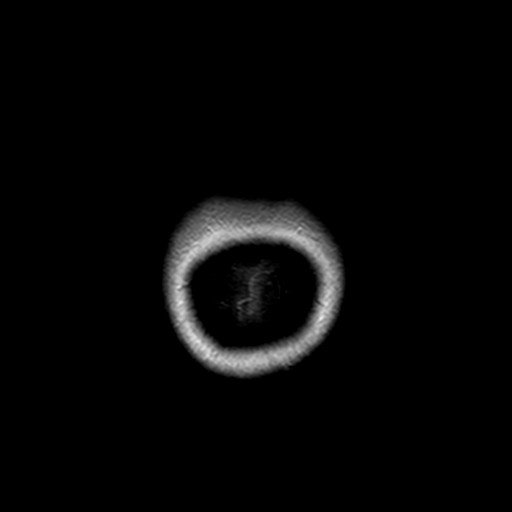

[17 of 48 positions shown; findings below may reference images not displayed]

FINDINGS: There is no significant cerebral cortical atrophy. There are mild 
increased T2 signal changes along the frontal horn margins, nonspecific. There 
is mild increased signal along the right occipital horn margin.. Diffusion 
images are unremarkable. There is no hydrocephalus or extra-axial collection. No 
pathologic parenchymal or meningeal enhancement identified. There is no evidence 
for intracranial mass. No focal brainstem or cerebellar lesion identified. Major 
intracranial arterial segments are open. Sellar contents are normal. There is 
approximately 5 mm cerebellar tonsillar ectopia. With the patient supine there 
is no cervical medullary compression. Paranasal sinuses and otomastoid spaces 
are clear.
IMPRESSION: Mild increased T2 signal along the anterior frontal horn and right occipital 
horn margins is nonspecific, although potentially associated with demyelinating 
disease. 
No other significant white matter signal abnormality. No discrete brainstem 
lesion. There is no pathologic enhancement, diffusion restriction or 
hydrocephalus. 
Mild cerebellar tonsillar ectopia. Tonsils extend 5 mm below the foramen magnum, 
and could represent mild or forme fruste Chiari I malformation. There is no 
cervical medullary compression with the patient supine although potentially this 
worsens with cranial flexion. Clinical correlation necessary.

## 2018-11-15 IMAGING — CT CT BRAIN WITHOUT CONTRAST
3 series · 15 of 47 positions shown, 18 images · non-contrast
Comparison: There are no previous exams available for comparison.

SUIT A 
CT BRAIN WITHOUT CONTRAST, 11/15/2018 [DATE]: 
CLINICAL INDICATION:  Occipital headaches, chronic. Patient states Chiari 
malformation 
A search for DICOM formatted images was conducted for prior CT imaging studies 
completed at a non-affiliated media free facility.
TECHNIQUE: The head was scanned from vertex through skull base without contrast 
on a high resolution CT scanner using dose reduction techniques. MPR 
reconstructions were performed.

[Series 3: axial · axial · 0.41mm/px · z∈[-160,-37]mm · 9 of 49 slices shown, 12 images]
[im 4/49  brain]
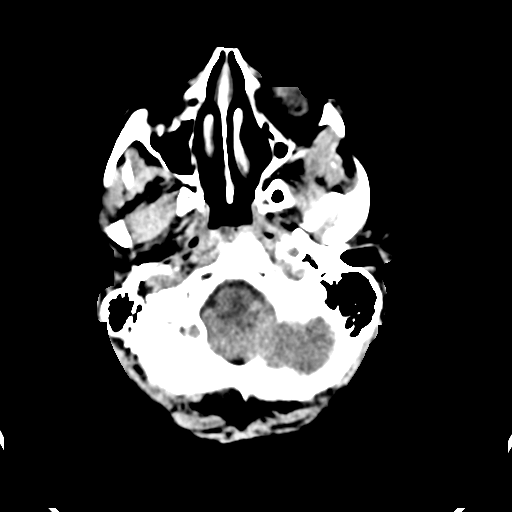
[im 4/49  bone]
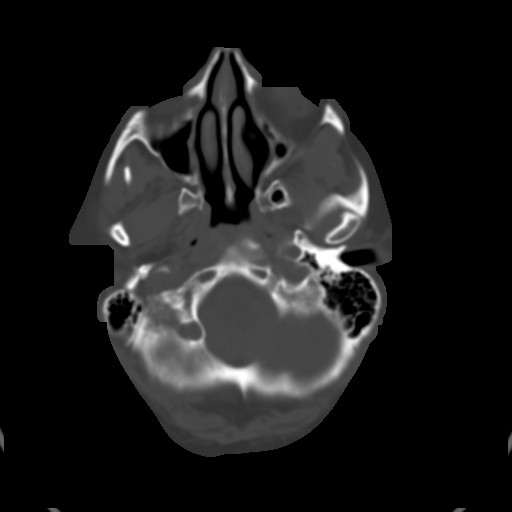
[im 9/49  brain]
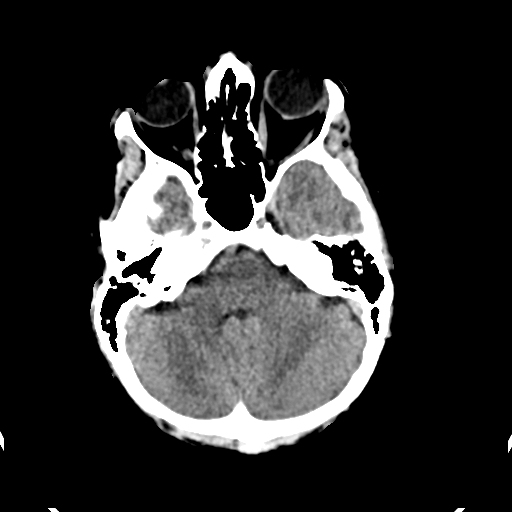
[im 14/49  brain]
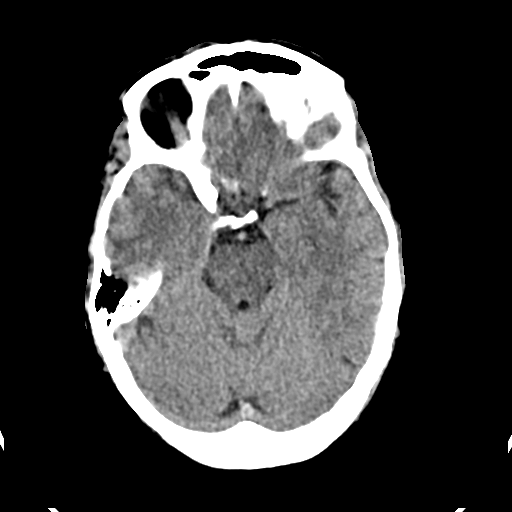
[im 19/49  brain]
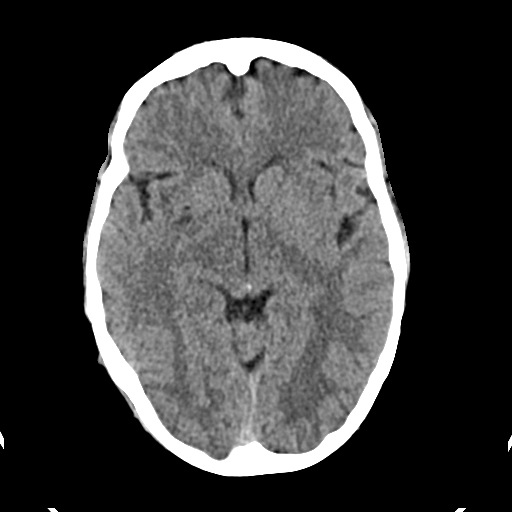
[im 25/49  brain]
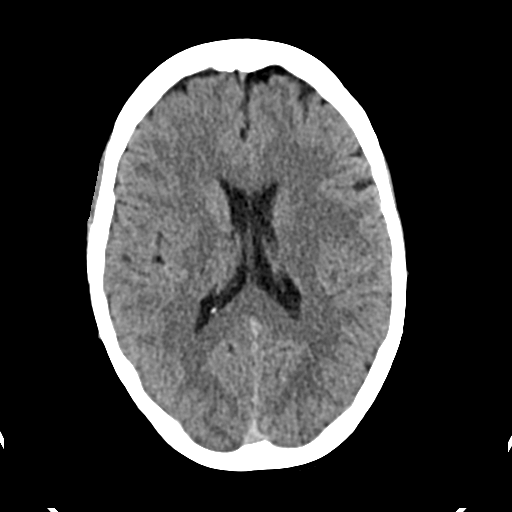
[im 25/49  bone]
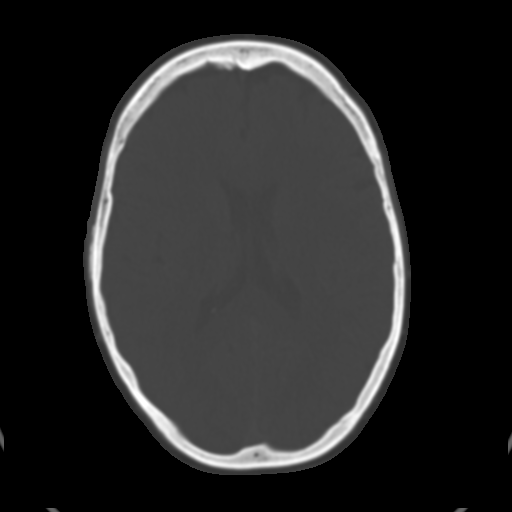
[im 30/49  brain]
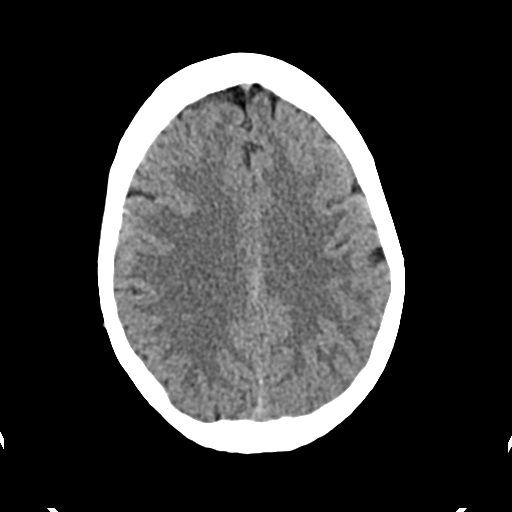
[im 35/49  brain]
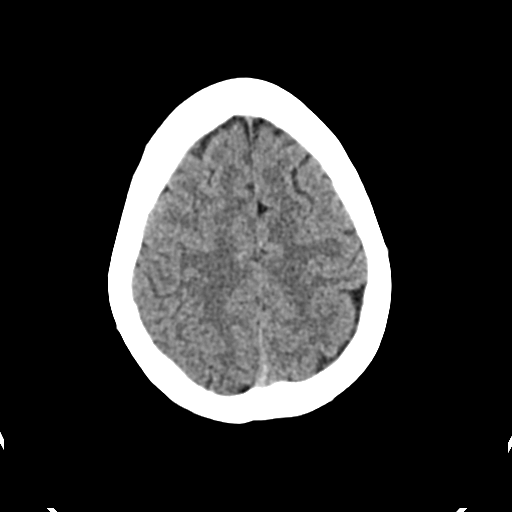
[im 40/49  brain]
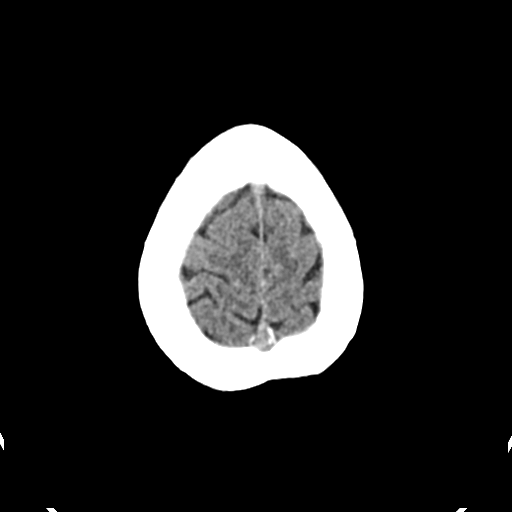
[im 45/49  brain]
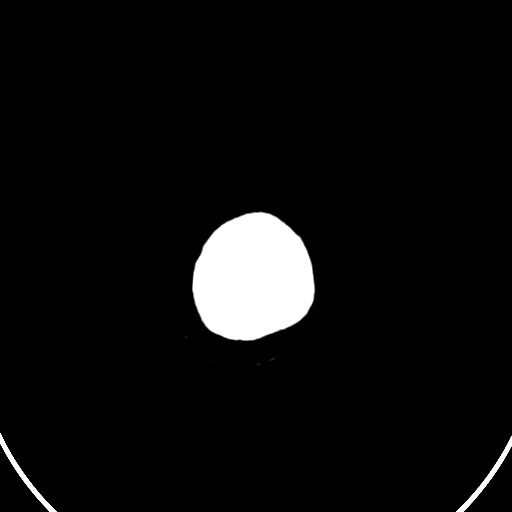
[im 45/49  bone]
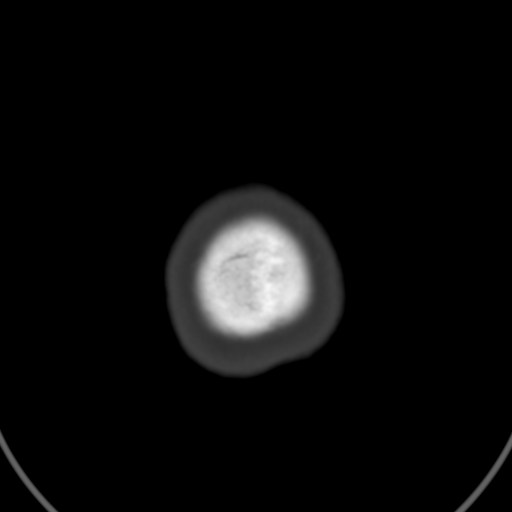

[Series 5: cor · coronal · 0.28mm/px · 3 of 82 slices shown]
[im 28/82  brain]
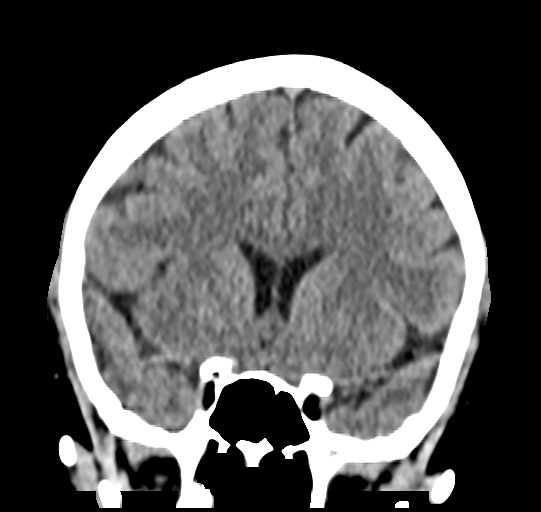
[im 37/82  brain]
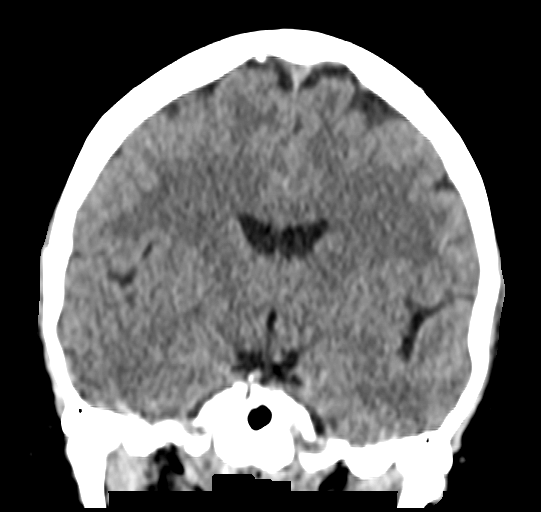
[im 46/82  brain]
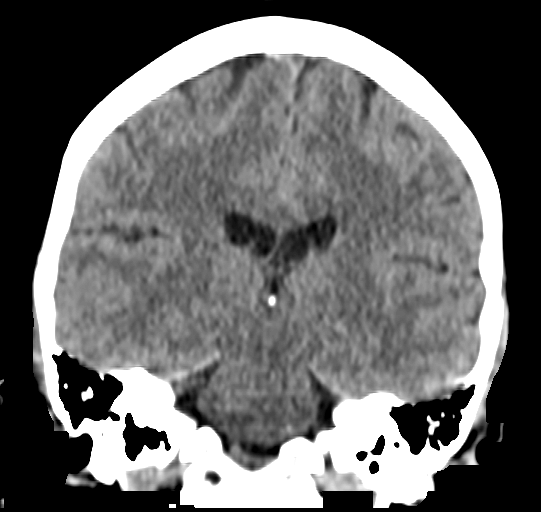

[Series 6: sag · sagittal · 0.28mm/px · 3 of 42 slices shown]
[im 14/42  brain]
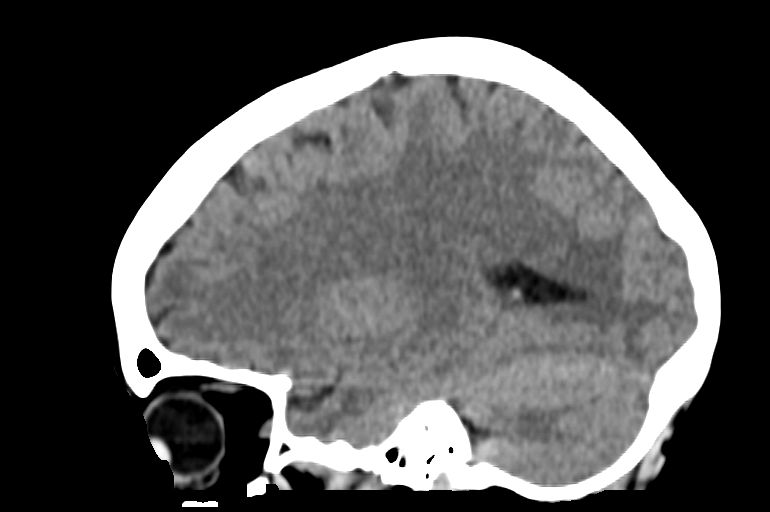
[im 21/42  brain]
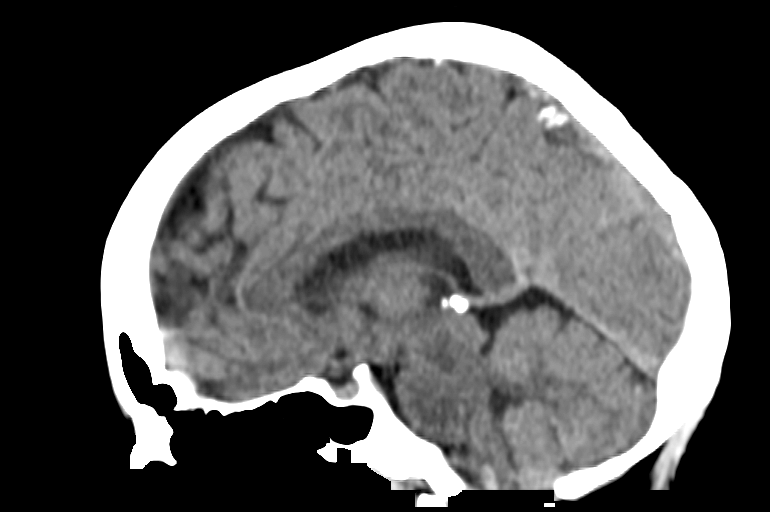
[im 28/42  brain]
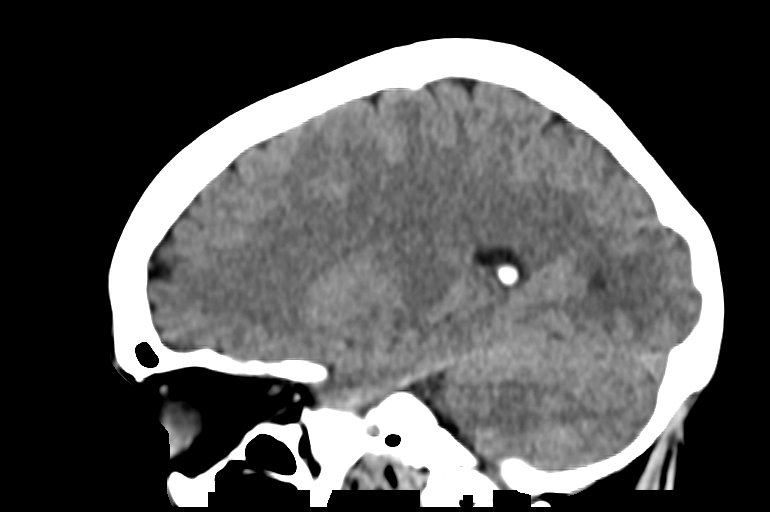

[15 of 47 positions shown; findings below may reference images not displayed]

FINDINGS: The cerebrum and cerebellum are unremarkable. On the sagittal view 
there appears that the cerebellar tonsil extends just into the spinal canal. No 
hydrocephalus. No infarction, or neoplasia or infarction.
IMPRESSION: Chiari Malformation with the cerebellar tonsils extending just into the spinal 
canal. 
The remaining brain is normal. 
MRI brain to follow. 
RADIATION DOSE REDUCTION: All CT scans are performed using radiation dose 
reduction techniques, when applicable.  Technical factors are evaluated and 
adjusted to ensure appropriate moderation of exposure.  Automated dose 
management technology is applied to adjust the radiation doses to minimize 
exposure while achieving diagnostic quality images.

## 2018-11-15 IMAGING — CT CT NECK WITHOUT CONTRAST
3 series · 15 of 27 positions shown, 18 images · non-contrast
Comparison: There are no previous exams available for comparison.

SUIT A 
CT NECK WITHOUT CONTRAST, 11/15/2018 [DATE]: 
CLINICAL INDICATION:  Occipital headaches. Patient states Chiari malformation. 
A search for DICOM formatted images was conducted for prior CT imaging studies 
completed at a non-affiliated media free facility.
TECHNIQUE: The neck was scanned from the level of the maxillary sinuses through 
the AP Window without contrast on a high-resolution CT scanner using dose 
reduction techniques. Routine MPR reconstructions were performed.

[Series 7: axial · axial · 0.42mm/px · z∈[-318,-170]mm · 5 of 112 slices shown, 7 images]
[im 19/112  soft-tissue]
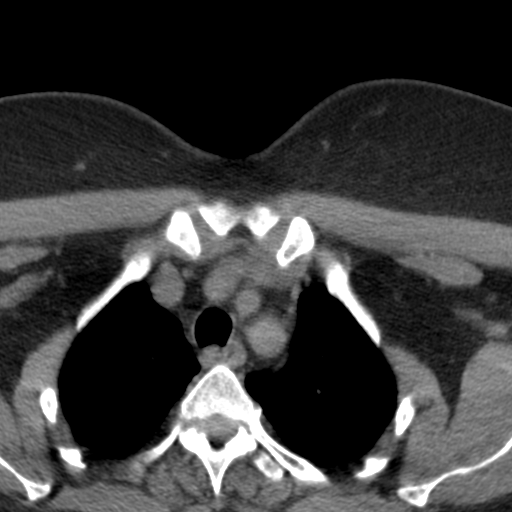
[im 19/112  bone]
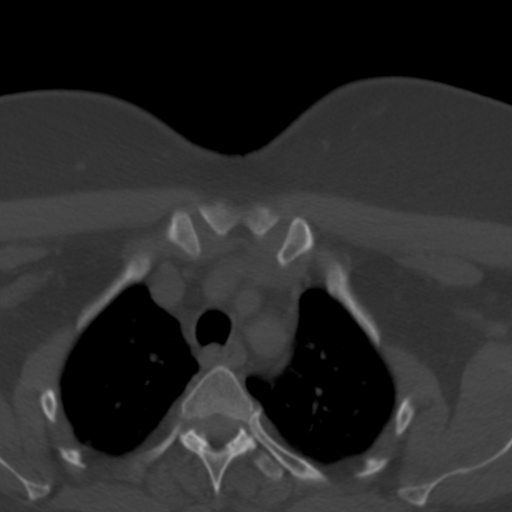
[im 38/112  bone]
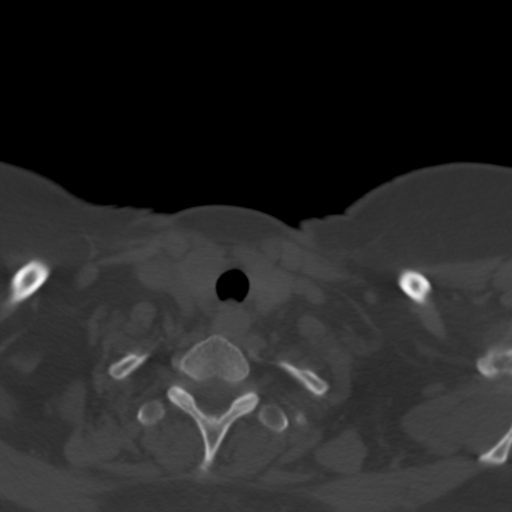
[im 56/112  bone]
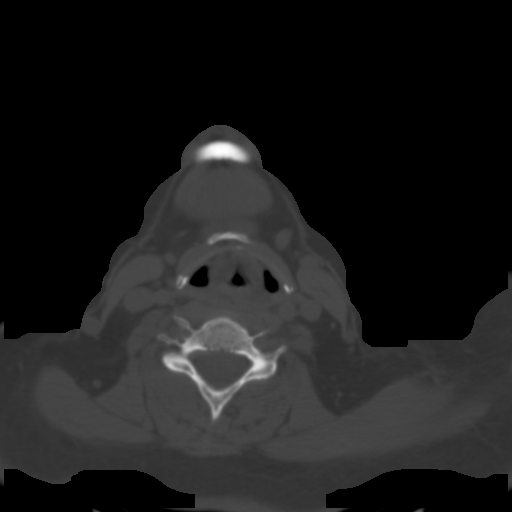
[im 75/112  bone]
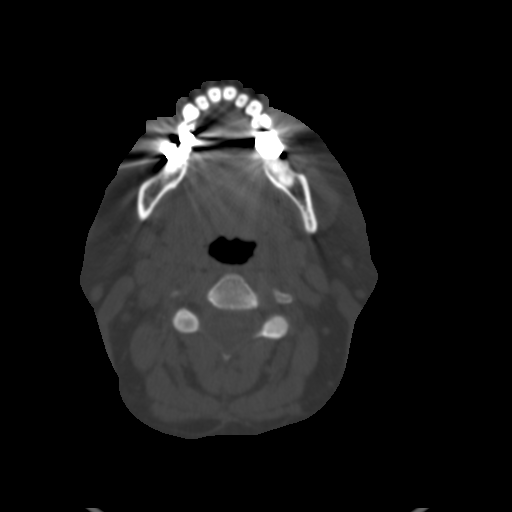
[im 93/112  soft-tissue]
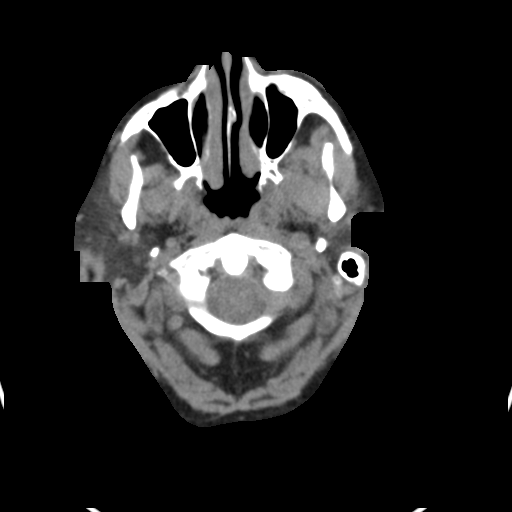
[im 93/112  bone]
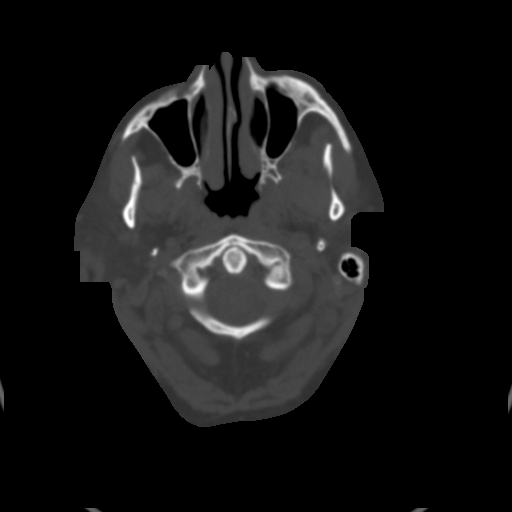

[Series 8: axial (person_name) · axial · 0.42mm/px · z∈[-318,-170]mm · 5 of 112 slices shown]
[im 19/112  bone]
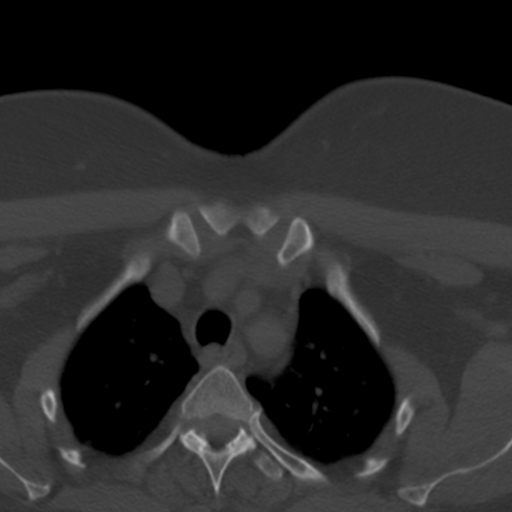
[im 38/112  bone]
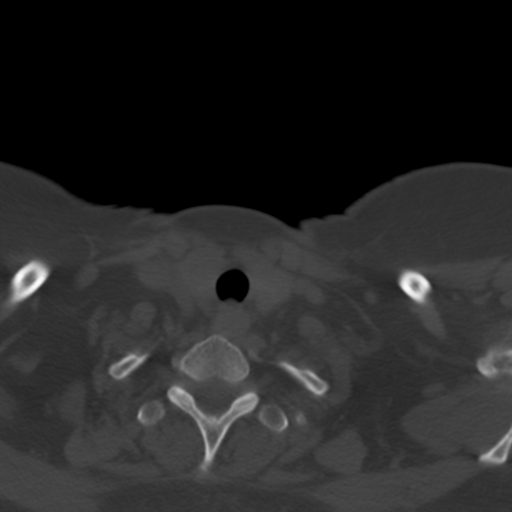
[im 56/112  bone]
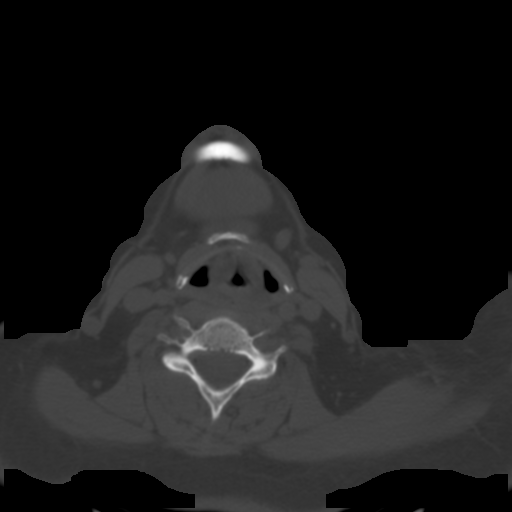
[im 75/112  bone]
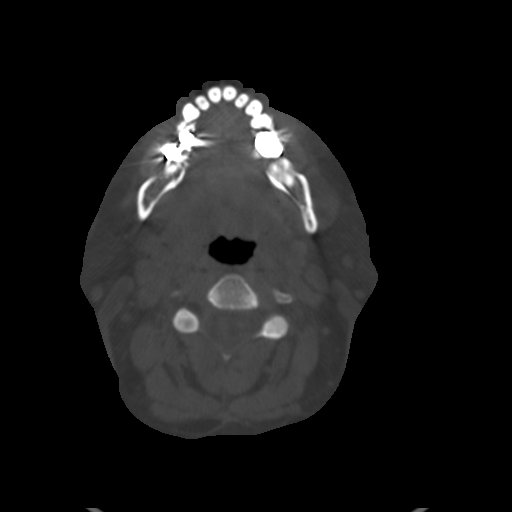
[im 93/112  bone]
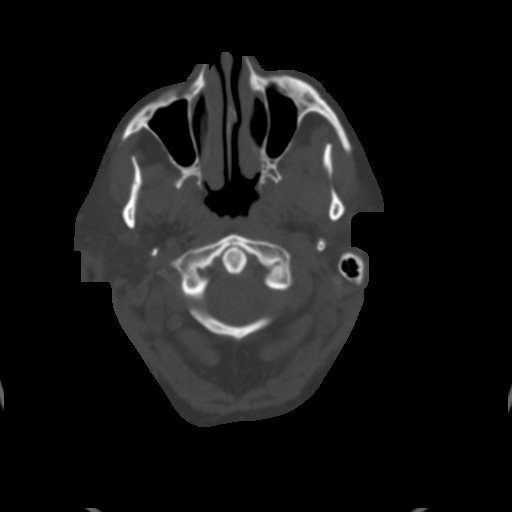

[Series 10: sag (person_name) · sagittal · 0.44mm/px · 5 of 92 slices shown, 6 images]
[im 31/92  bone]
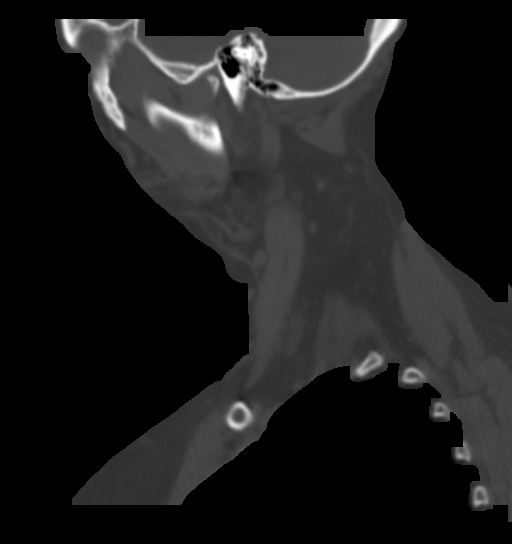
[im 38/92  bone]
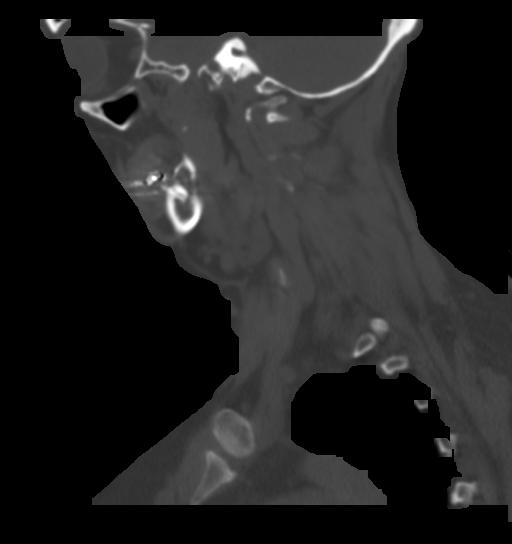
[im 46/92  soft-tissue]
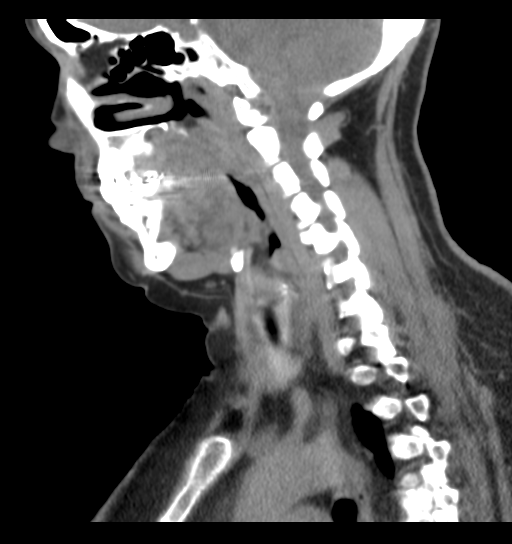
[im 46/92  bone]
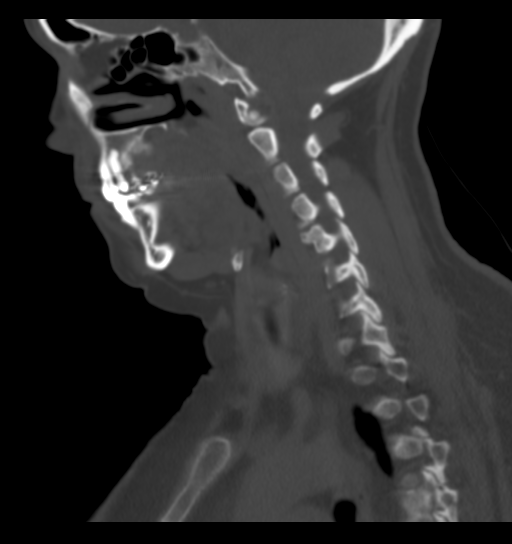
[im 54/92  bone]
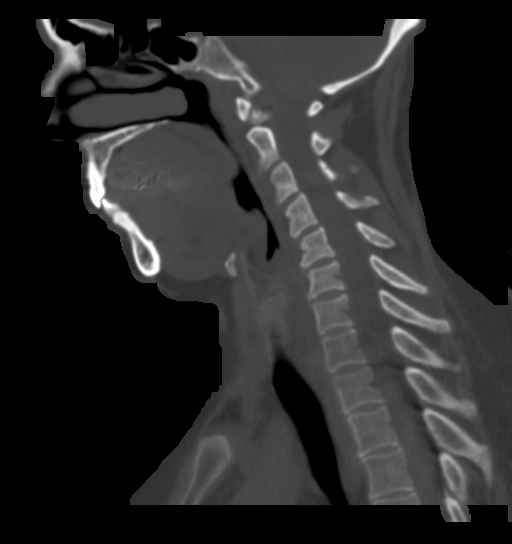
[im 61/92  bone]
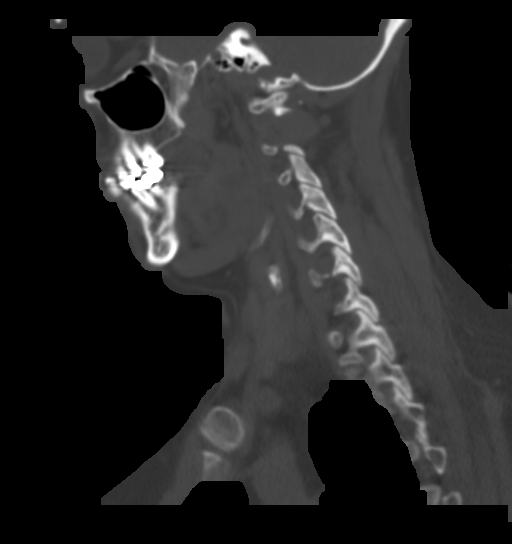

[15 of 27 positions shown; findings below may reference images not displayed]

FINDINGS: There is minimal extension of the cerebellar tonsil into the spinal 
canal. 
There is minimal dextroscoliosis. Straightening of the cervical spine. Vertebral 
bodies and disc spaces are well-maintained. No disc herniation or spinal 
stenosis. 
Upper lungs appear clear.
IMPRESSION: Mild Chiari malformation. 
Straightening of the cervical spine, which may indicate cervical muscle spasm. 
RADIATION DOSE REDUCTION: All CT scans are performed using radiation dose 
reduction techniques, when applicable.  Technical factors are evaluated and 
adjusted to ensure appropriate moderation of exposure.  Automated dose 
management technology is applied to adjust the radiation doses to minimize 
exposure while achieving diagnostic quality images.

## 2019-08-20 IMAGING — MG MAMMOGRAPHY SCREENING BILATERAL 3D TOMOSYNTHESIS WITH CAD
8 series · 8 of 24 positions shown · non-contrast
Comparison: August 17, 2018 
BREAST DENSITY: (Level B) There are scattered areas of fibroglandular density.

MAMMOGRAPHY SCREENING BILATERAL 3D TOMOSYNTHESIS WITH CAD, 08/20/2019 [DATE]: 
CLINICAL INDICATION: Screening.
TECHNIQUE: Bilateral oblique mediolateral and craniocaudal full field digital 
mammogram and 3-D Tomosynthesis were obtained.  In addition, computer-aided 
detection was utilized.

[R MLO]
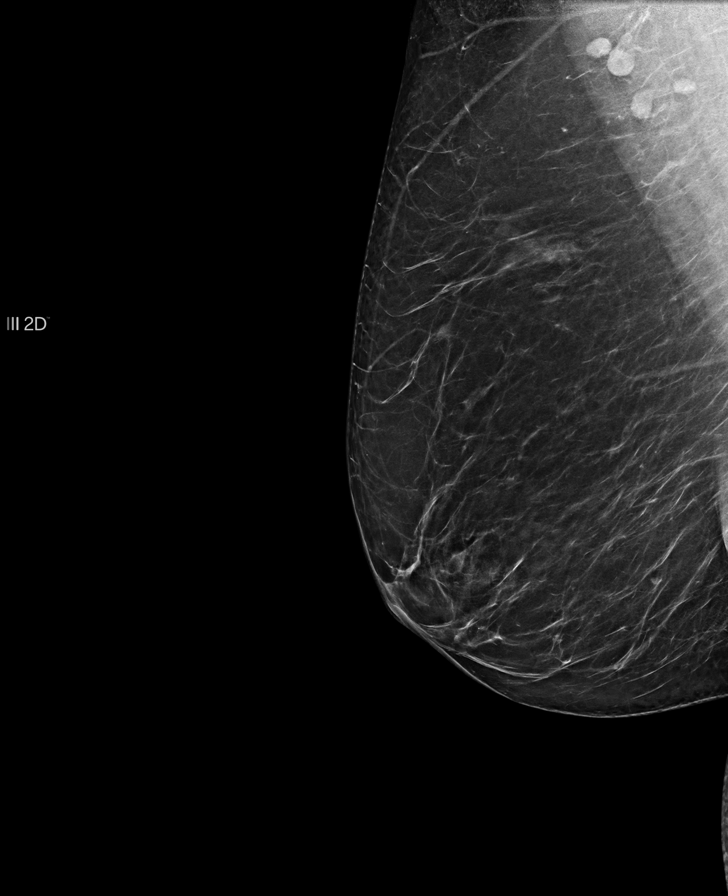

[L MLO]
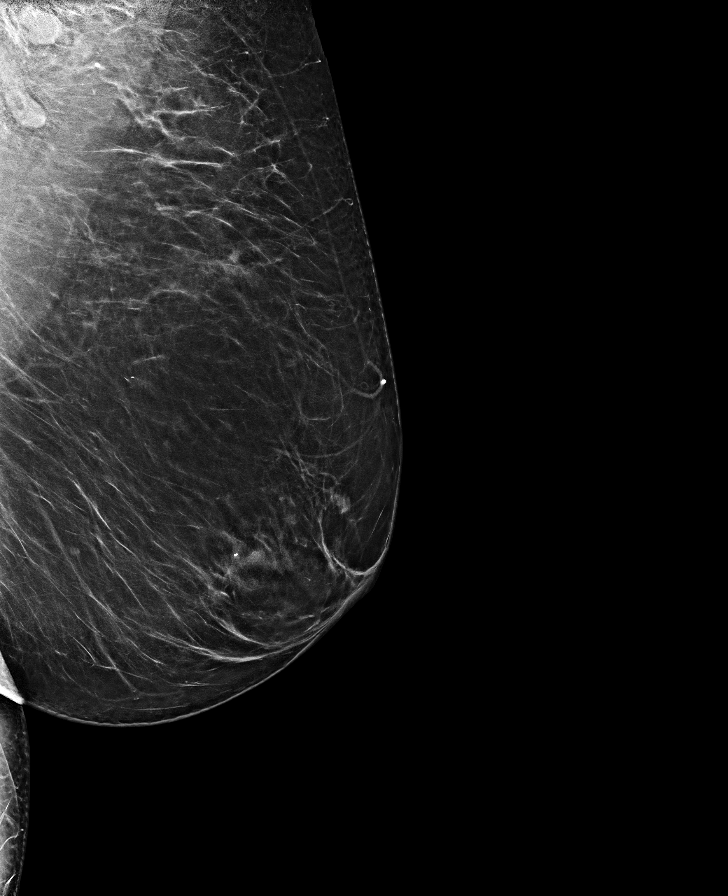

[R CC]
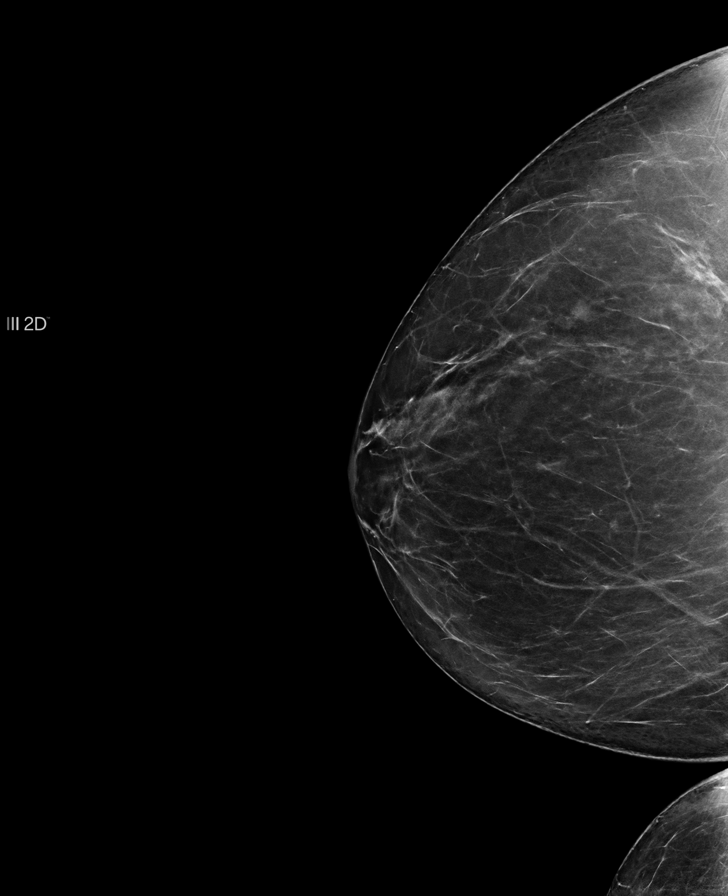

[L CC]
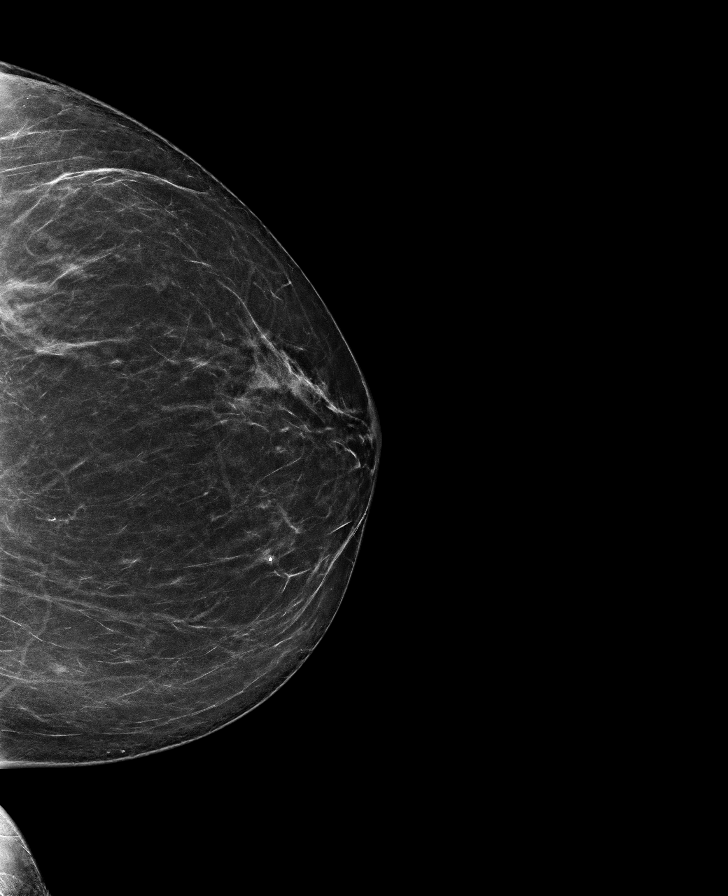

[R CC tomo · tomo slice 33/66.0]
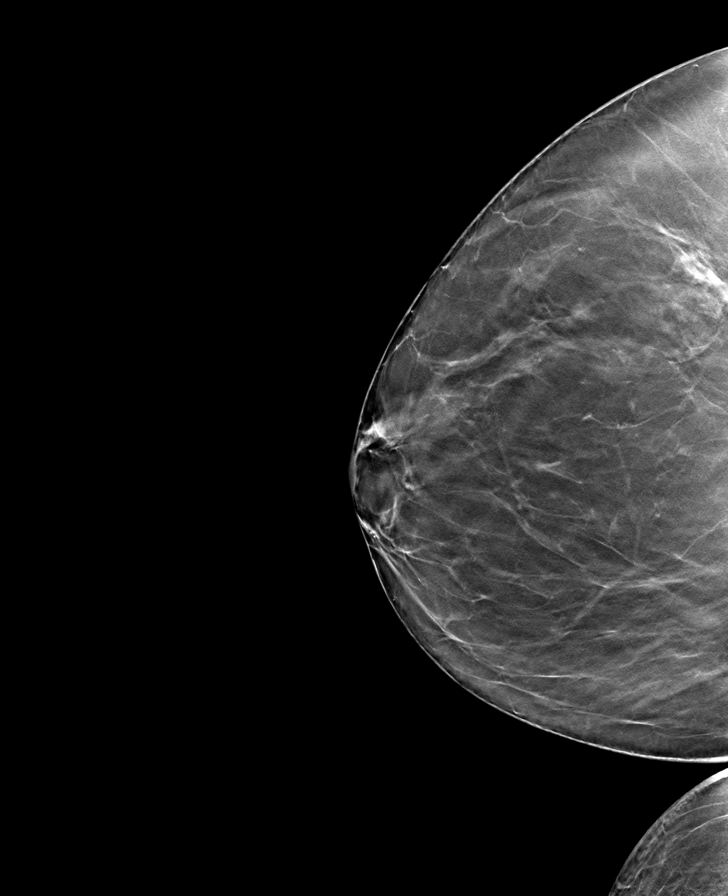

[L MLO tomo · tomo slice 41/81.0]
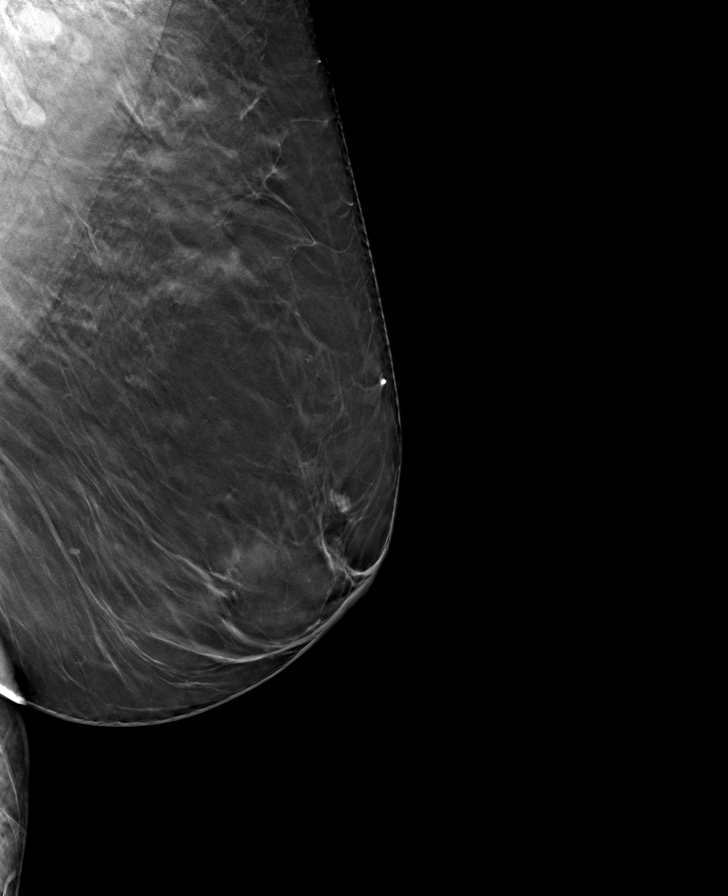

[L CC tomo · tomo slice 35/69.0]
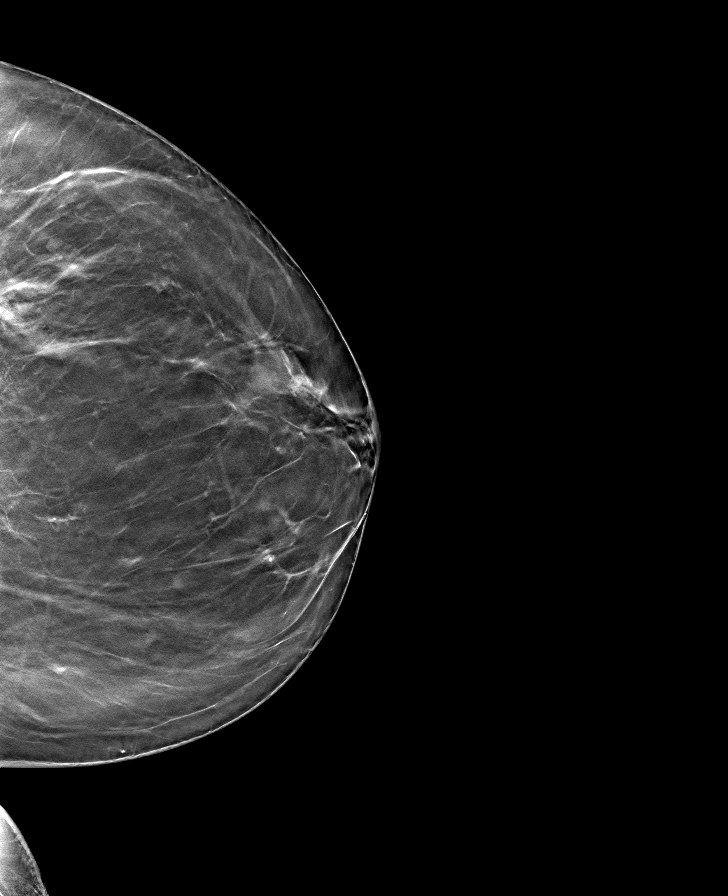

[R MLO tomo · tomo slice 38/75.0]
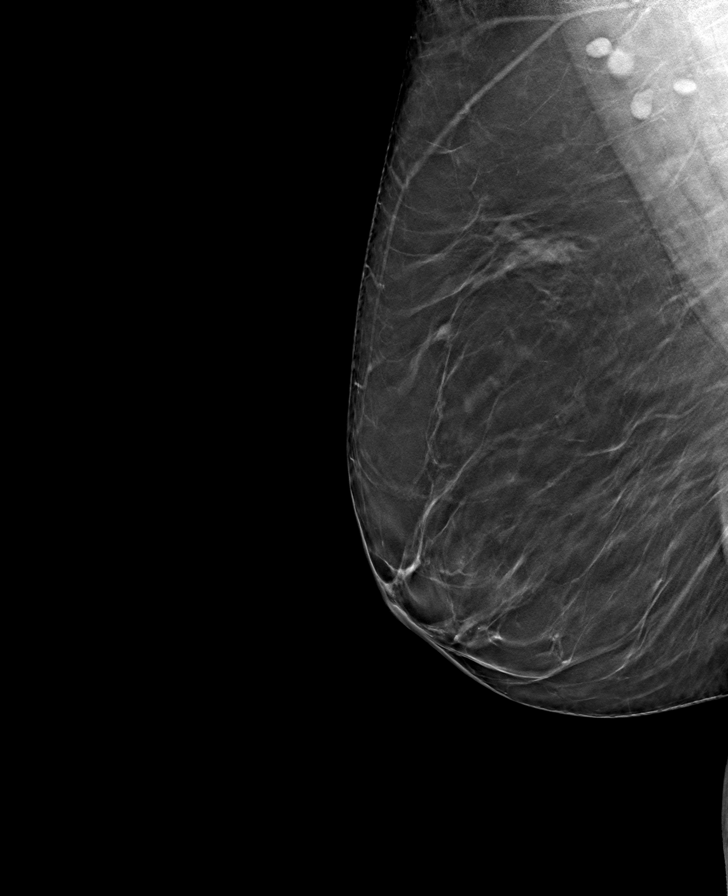

[8 of 24 positions shown; findings below may reference images not displayed]

FINDINGS: The breasts are symmetric. There are no dominant mass or clustered 
microscopic calcifications. There is no skin thickening. The tomographic images 
demonstrate no area of concern.
IMPRESSION: ( BI-RADS 2) Benign findings. Routine mammographic follow-up is recommended.

## 2020-09-10 IMAGING — MG MAMMOGRAPHY SCREENING BILATERAL 3[PERSON_NAME]
8 series · 8 of 24 positions shown · non-contrast
Comparison: 08/20/2019 and dating back to 02/25/2016.

FINAL Diagnostic Imaging Report 
________________________________________________________________________________________________ 
MAMMOGRAPHY SCREENING BILATERAL 3THEOPHIL MANNHART, 09/10/2020 [DATE]: 
CLINICAL INDICATION: Screening exam.
TECHNIQUE: Digital bilateral mammograms and 3-D Tomosynthesis were obtained. 
These were interpreted both primarily and with the aid of computer-aided 
detection system.  
DENSITY: (Level B) There are scattered areas of fibroglandular density.

[L CC]
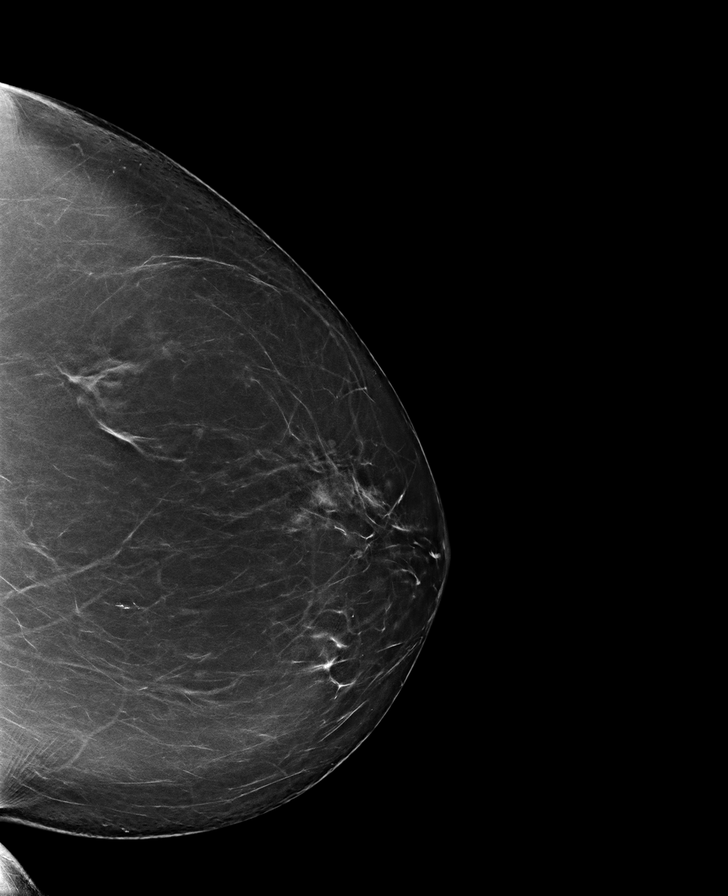

[R CC]
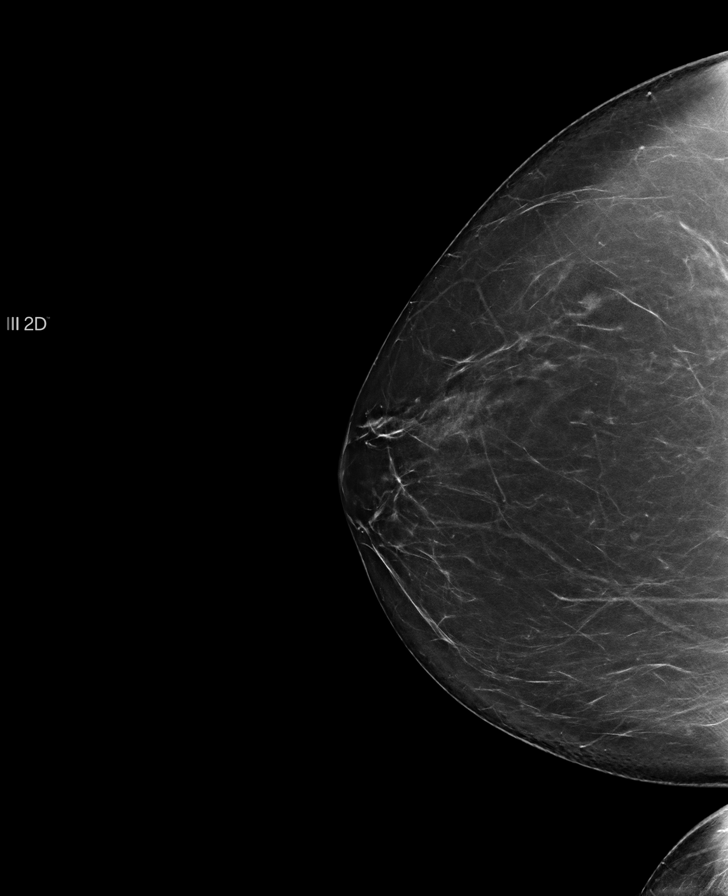

[L MLO]
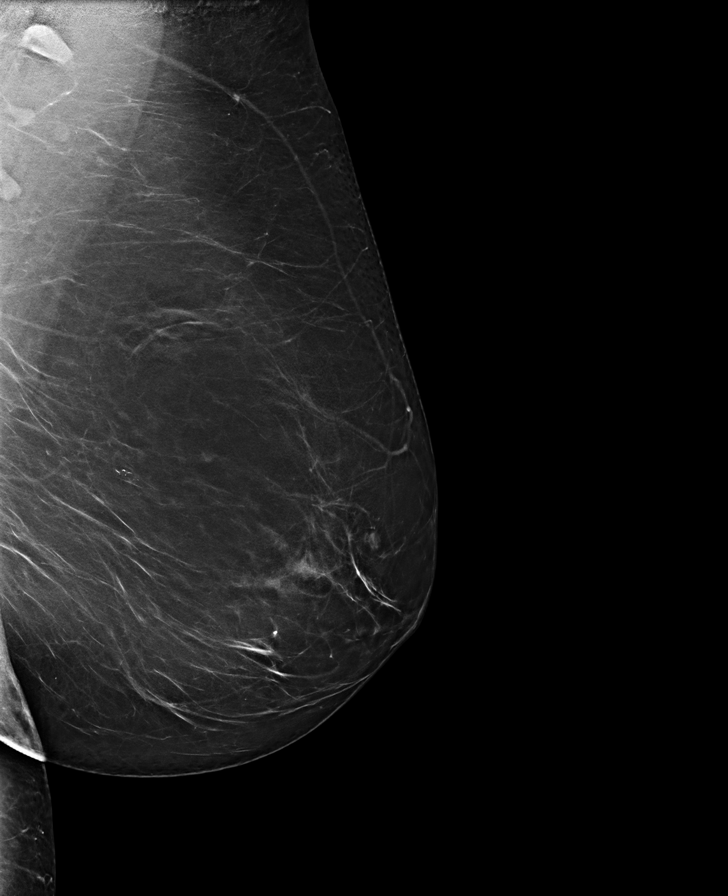

[R MLO]
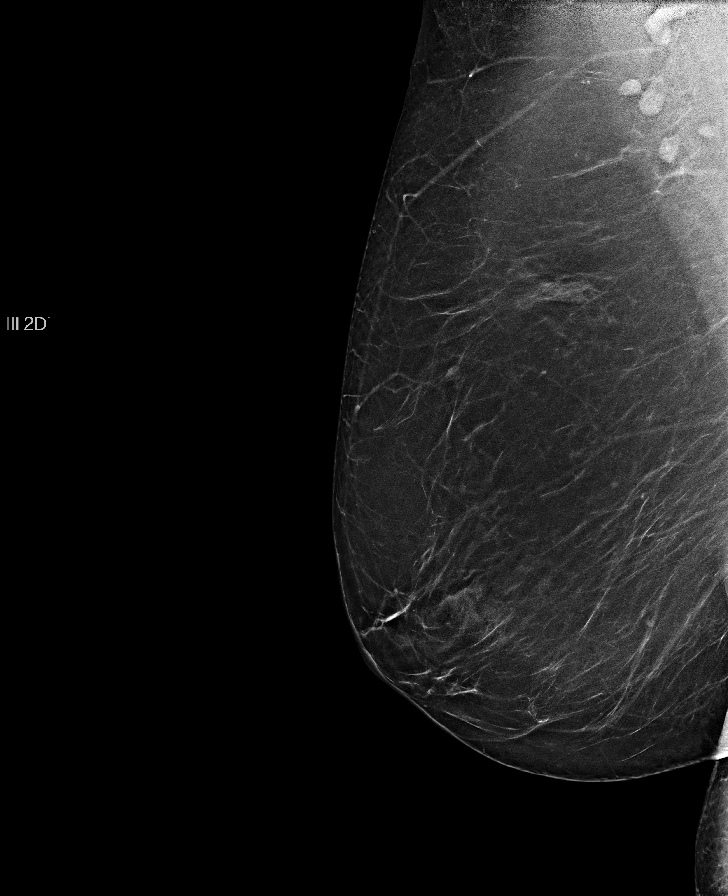

[R MLO tomo · tomo slice 45/89.0]
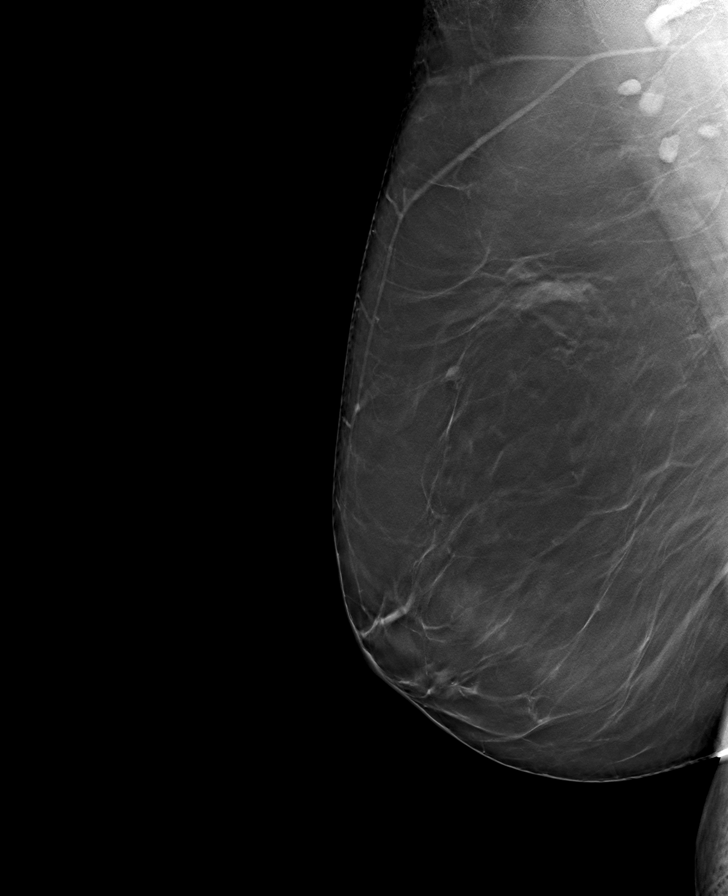

[L MLO tomo · tomo slice 49/96.0]
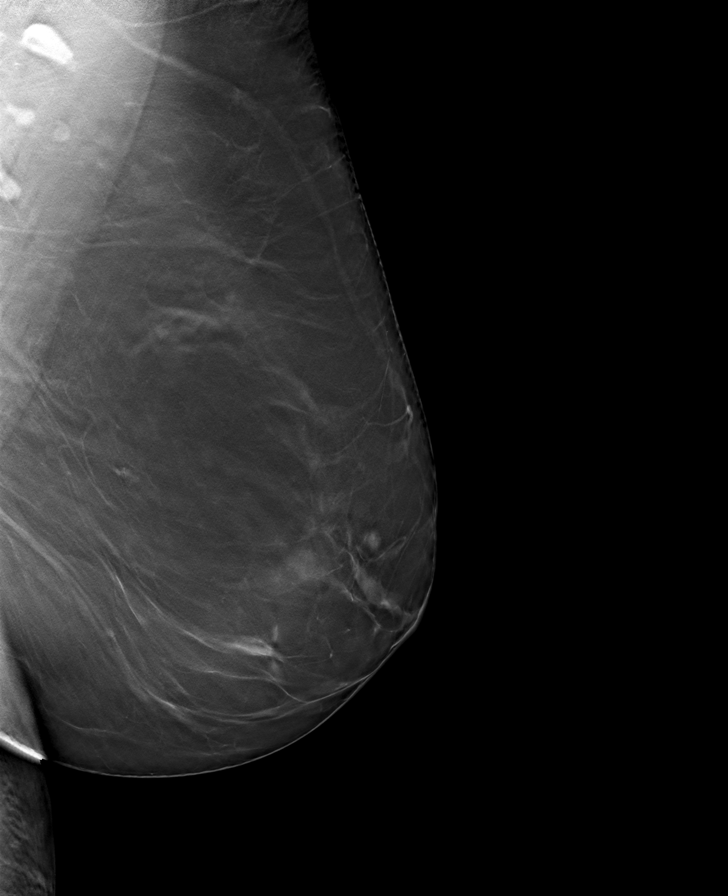

[L CC tomo · tomo slice 45/88.0]
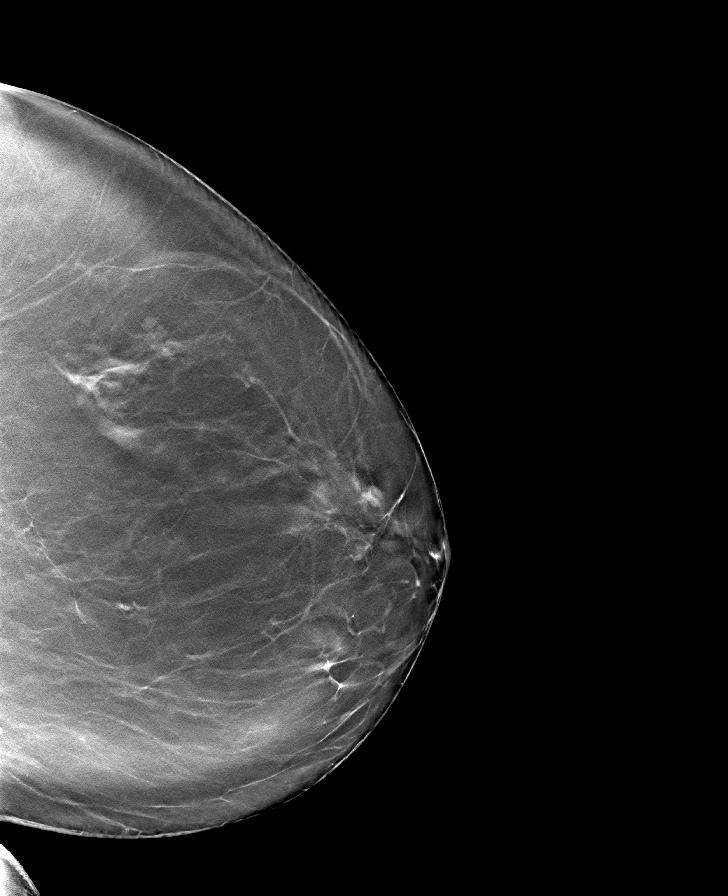

[R CC tomo · tomo slice 40/79.0]
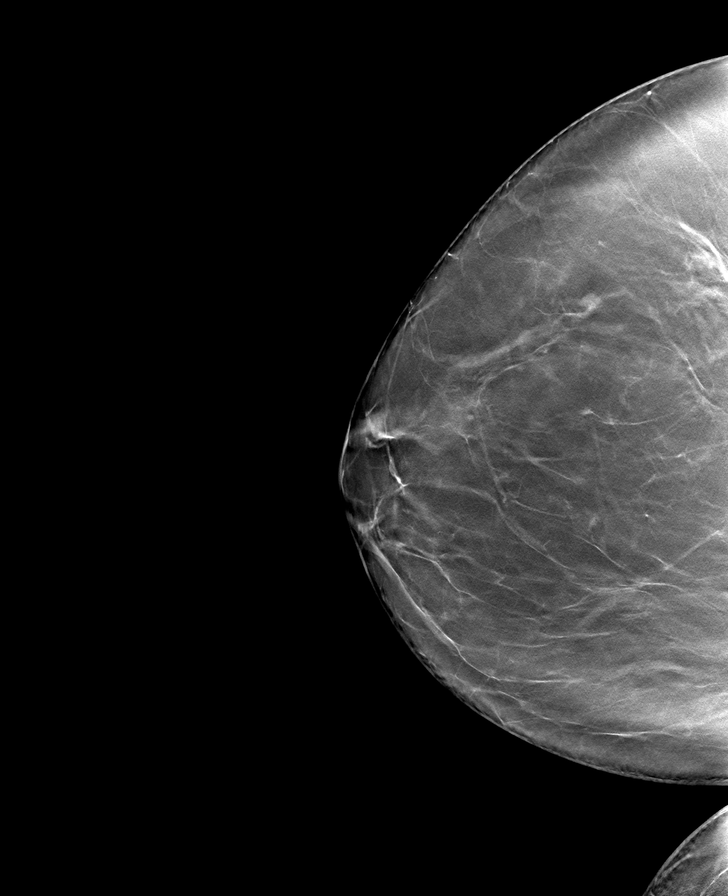

[8 of 24 positions shown; findings below may reference images not displayed]

FINDINGS: Overall stable mammographic appearance. No suspicious mass, 
calcifications, or area of architectural distortion in either breast.
IMPRESSION: No mammographic evidence of malignancy in either breast. 
( BI-RADS 2) Benign findings. Routine mammographic follow-up is recommended.

## 2021-05-15 IMAGING — CT CT ABDOMEN PELVIS W/ UROGRAM
2 of 6 series · 11 of 46 positions shown, 12 images · IV contrast (Iodine)
Comparison: None

________________________________________________________________________________________________ 
CT ABDOMEN PELVIS W/ UROGRAM, 05/15/2021 [DATE]: 
CLINICAL INDICATION: Microhematuria for the past few weeks, bilateral flank 
pain, urinary [HOSPITAL] calcium oxalate, previous hysterectomy 
A search for DICOM formatted images was conducted for prior CT imaging studies 
completed at a non-affiliated media free facility.
TECHNIQUE: The region of interest was scanned without and with 100 mL of Isovue 
300 contrast injected intravenously on a high resolution CT scanner using dose 
reduction techniques.  Routine MPR reconstructions were performed.

[Series 501: axial portal, idose (3) · axial · portal-venous · 0.77mm/px · z∈[+574,+949]mm · 8 of 151 slices shown, 9 images]
[im 13/151  soft-tissue]
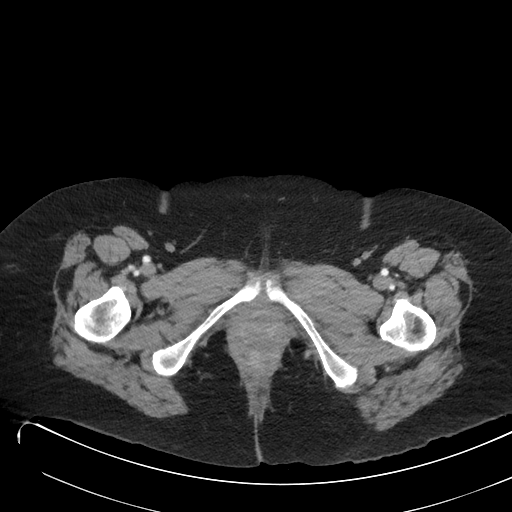
[im 13/151  bone]
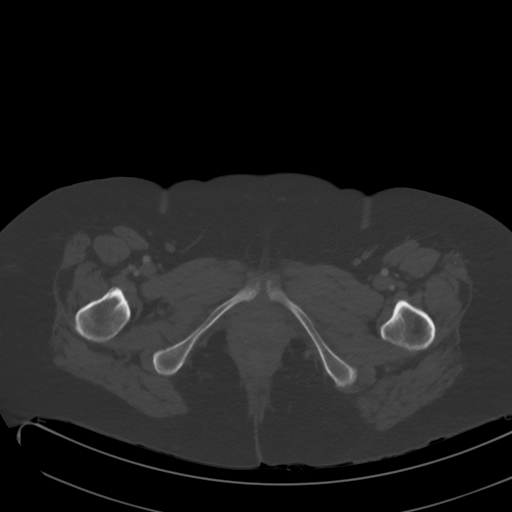
[im 38/151  soft-tissue]
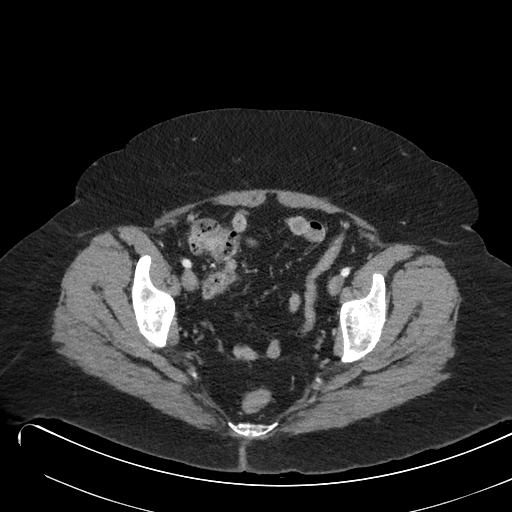
[im 51/151  soft-tissue]
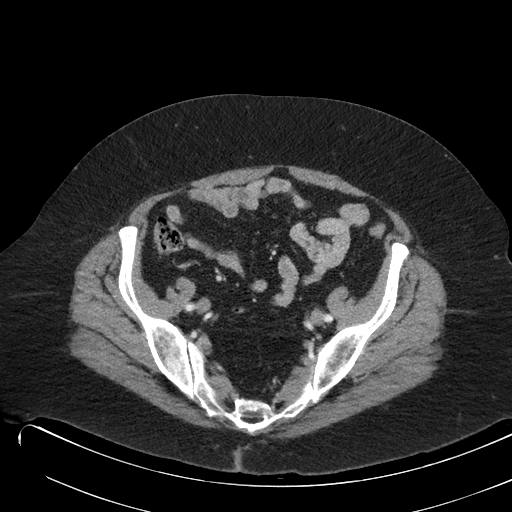
[im 63/151  soft-tissue]
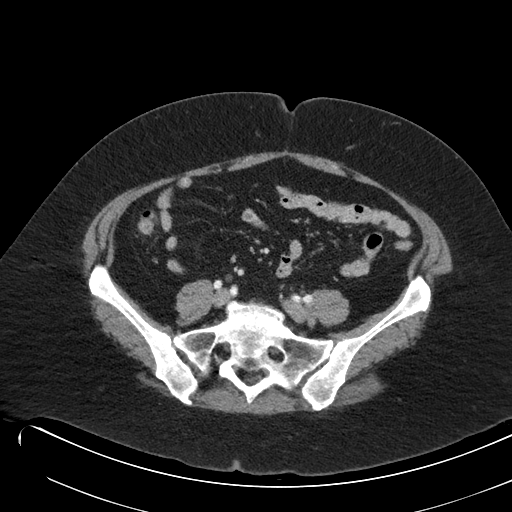
[im 88/151  soft-tissue]
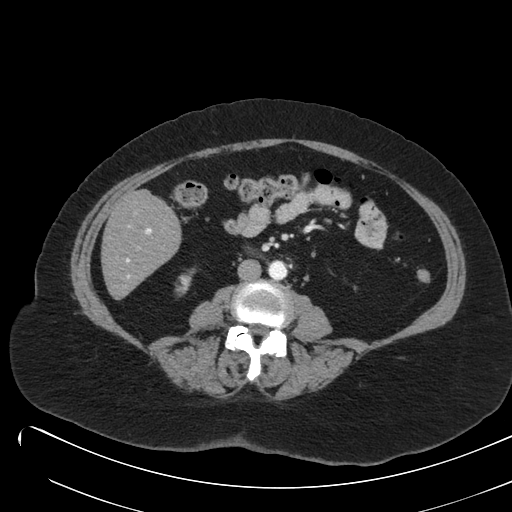
[im 101/151  soft-tissue]
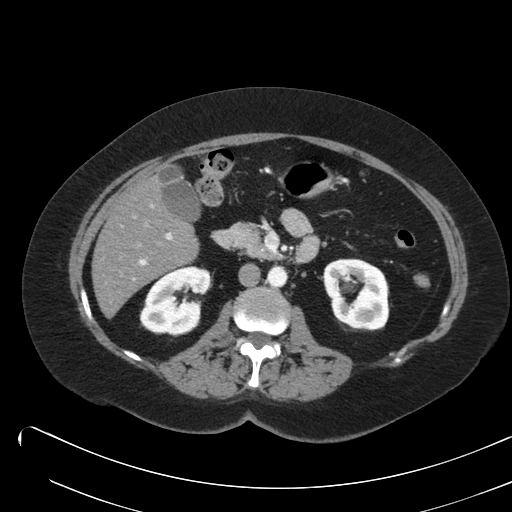
[im 113/151  soft-tissue]
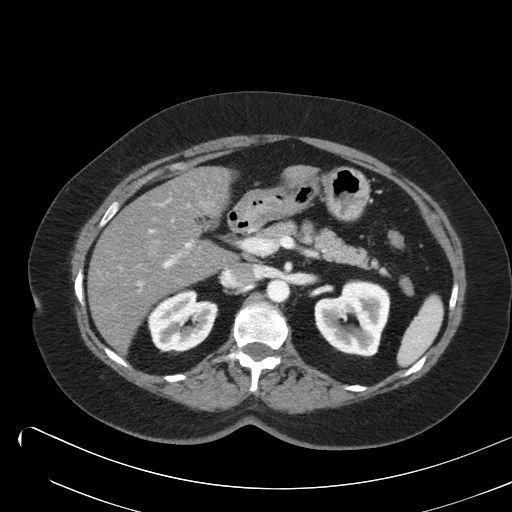
[im 138/151  soft-tissue]
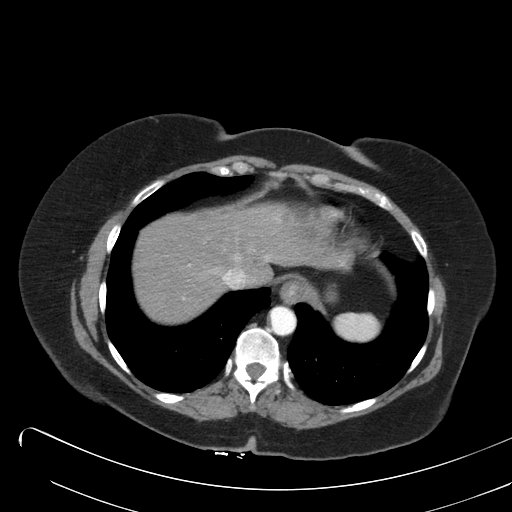

[Series 502: coronal portal, idose (3) · coronal · portal-venous · 0.45mm/px · 3 of 109 slices shown]
[im 28/109  soft-tissue]
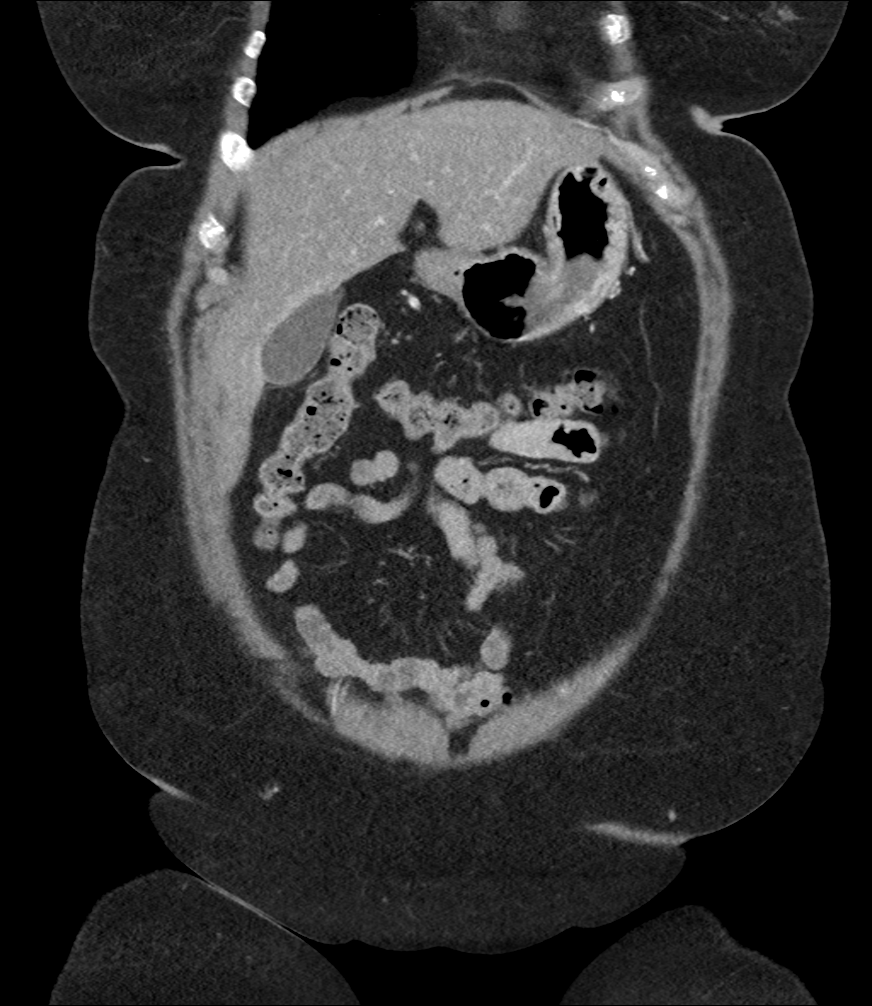
[im 55/109  soft-tissue]
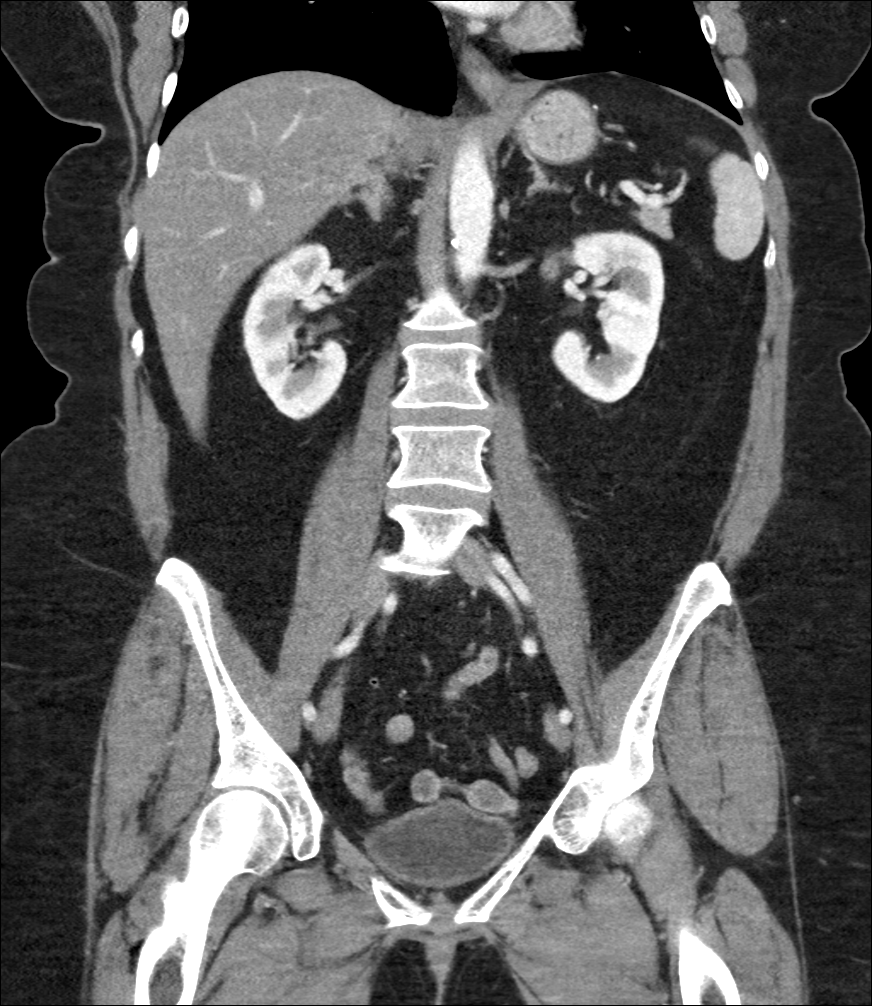
[im 82/109  soft-tissue]
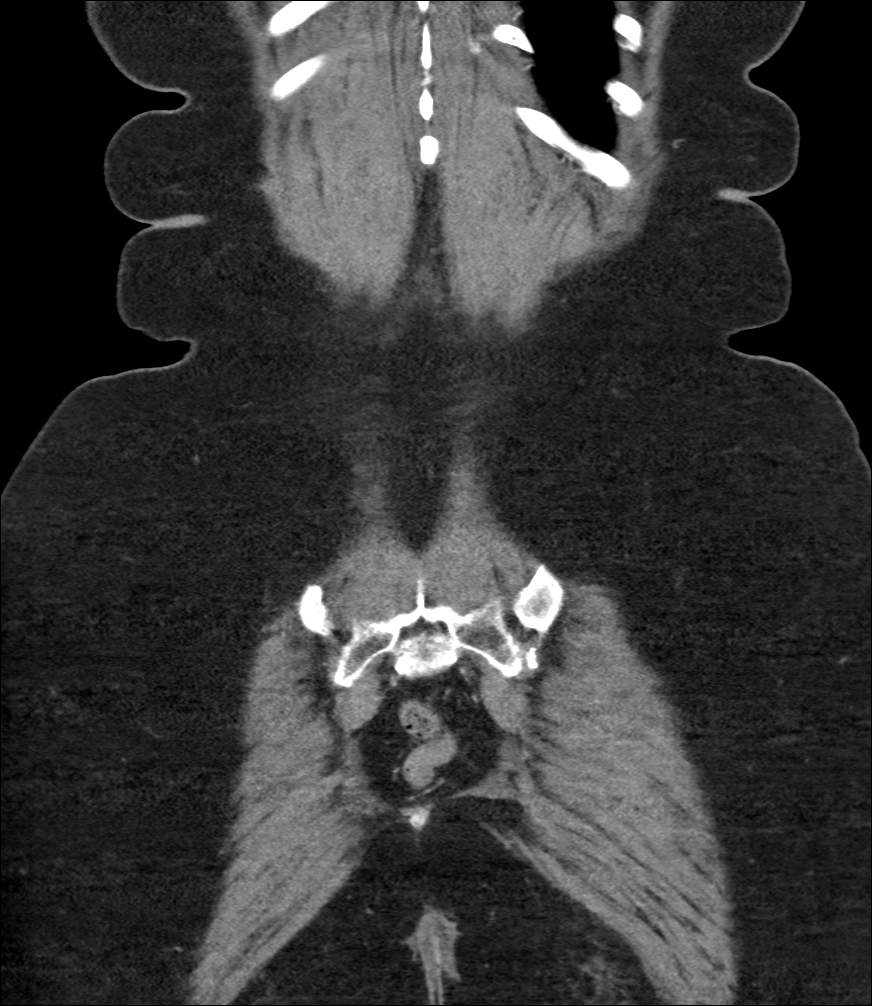

[11 of 46 positions shown; findings below may reference images not displayed]

FINDINGS: KIDNEYS: No masses.  No hydronephrosis.  No nephrolithiasis. Left lower pole 
parapelvic cyst. Right midpole intracortical cyst. 
URETERS: Normal caliber without filling defects.   
BLADDER: No mass.  Normal wall thickness.  No stone. 
LUNG BASES: Clear. 
HEPATOBILIARY: Fatty, mildly enlarged liver. Nonenhancing LEFT lobe septated
x 2.4 cm cyst. Sparing in the pericholecystic location. No mass or biliary 
dilatation. No gallstones. 
SPLEEN: Within normal limits. 
PANCREAS: No mass.  No pancreatic fluid collections. 
ADRENALS: No masses. 
LYMPH NODES: No adenopathy. 
STOMACH, SMALL BOWEL AND COLON: No bowel wall thickening or obstruction. 
VASCULAR STRUCTURES: No aneurysm.  
MUSCULOSKELETAL: Lower lumbar levoscoliosis , degenerative spondylosis and facet 
arthritis. 
ADDITIONAL FINDINGS: Small periumbilical hernia. No free fluid or free air.
IMPRESSION: Normal CT urogram. No etiology for microhematuria identified. 
Fatty enlarged liver. 
Findings will be called to the referring clinicians office by the radiology 
assistant. 
RADIATION DOSE REDUCTION: All CT scans are performed using radiation dose 
reduction techniques, when applicable.  Technical factors are evaluated and 
adjusted to ensure appropriate moderation of exposure.  Automated dose 
management technology is applied to adjust the radiation doses to minimize 
exposure while achieving diagnostic quality images.

## 2021-06-11 IMAGING — DX SKELETAL SURVEY, COMPLETE
1 series · 8 of 10 positions shown · non-contrast
Comparison: 05/15/2021 CT abdomen/pelvis

________________________________________________________________________________________________ 
SKELETAL SURVEY, COMPLETE, 06/11/2021 [DATE]: 
CLINICAL INDICATION: Myeloma

[Series 1: lateral · U · 0.14mm/px · 8 of 16 slices shown]
[im 1/16]
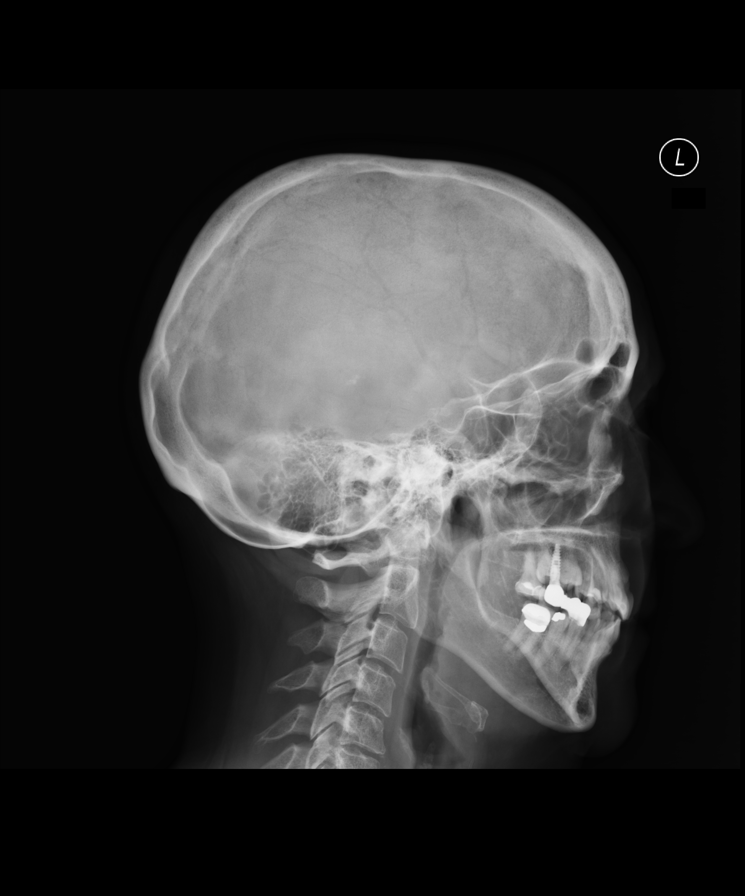
[im 2/16]
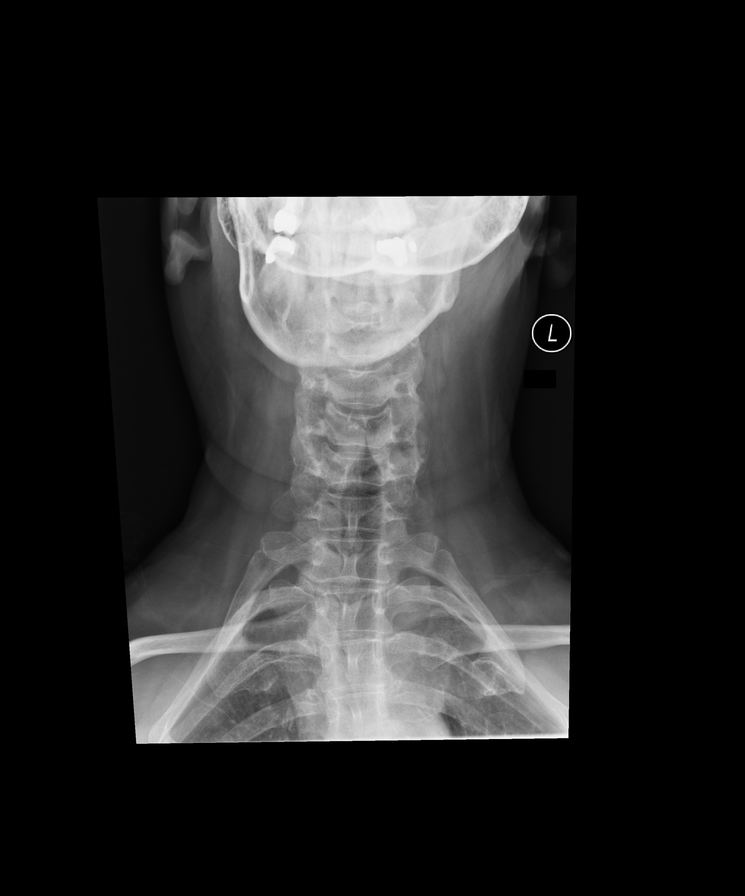
[im 4/16]
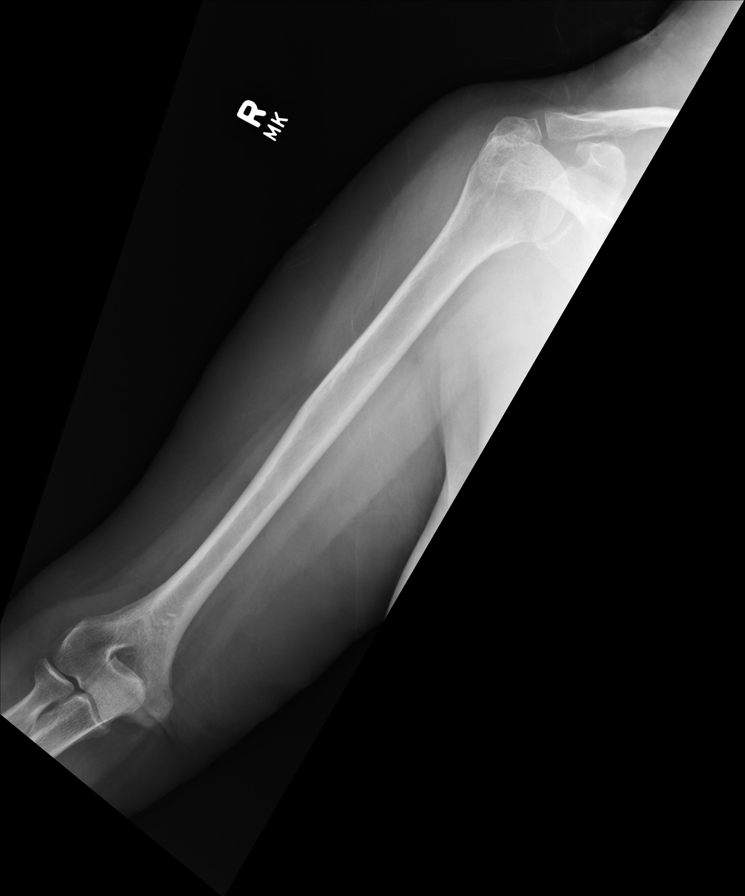
[im 6/16]
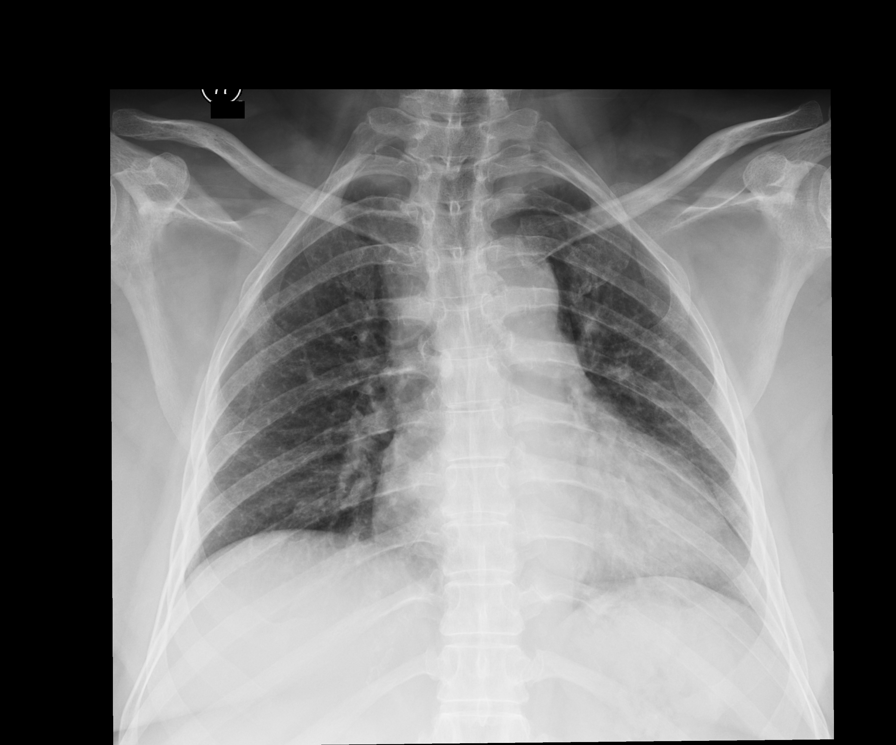
[im 7/16]
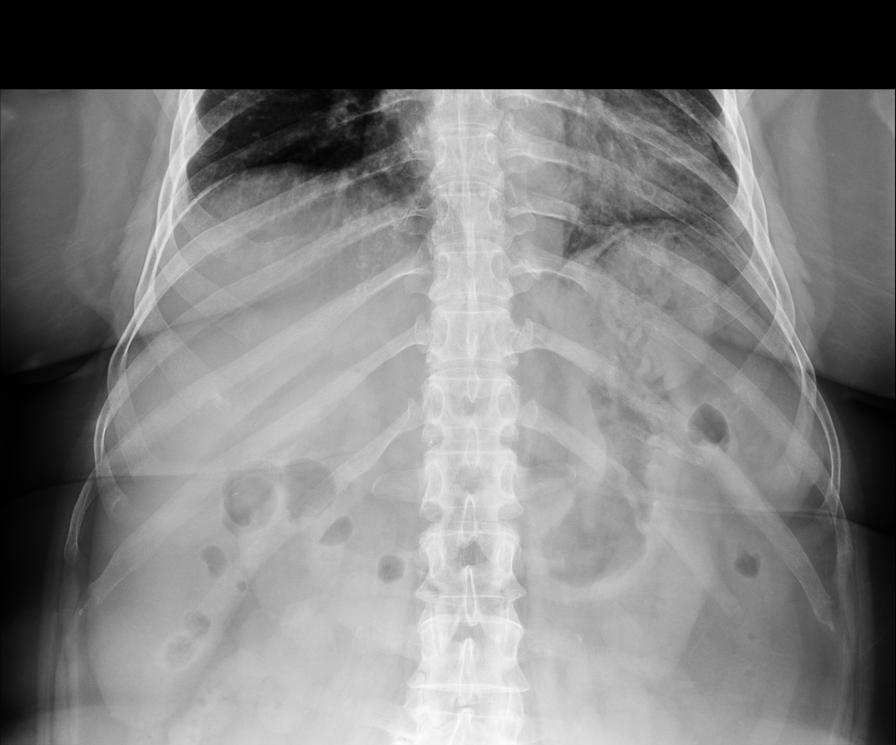
[im 9/16]
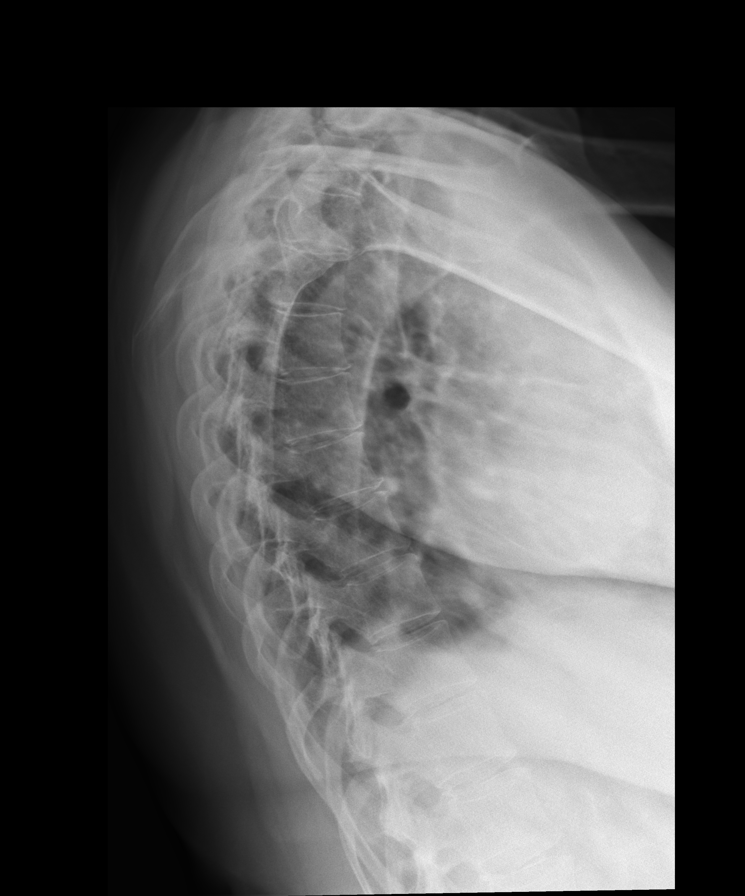
[im 11/16]
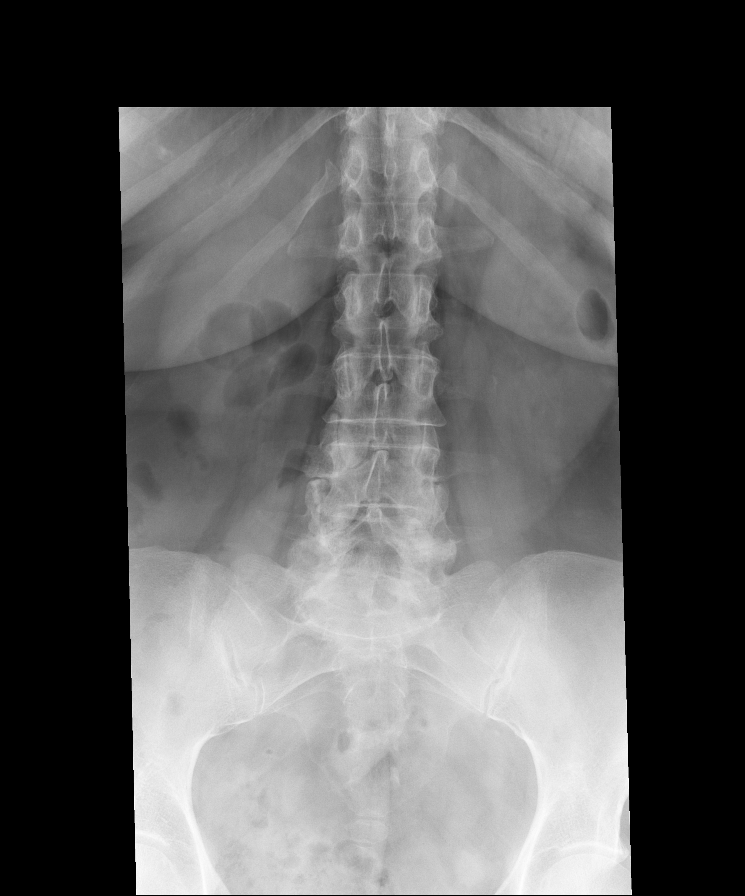
[im 12/16]
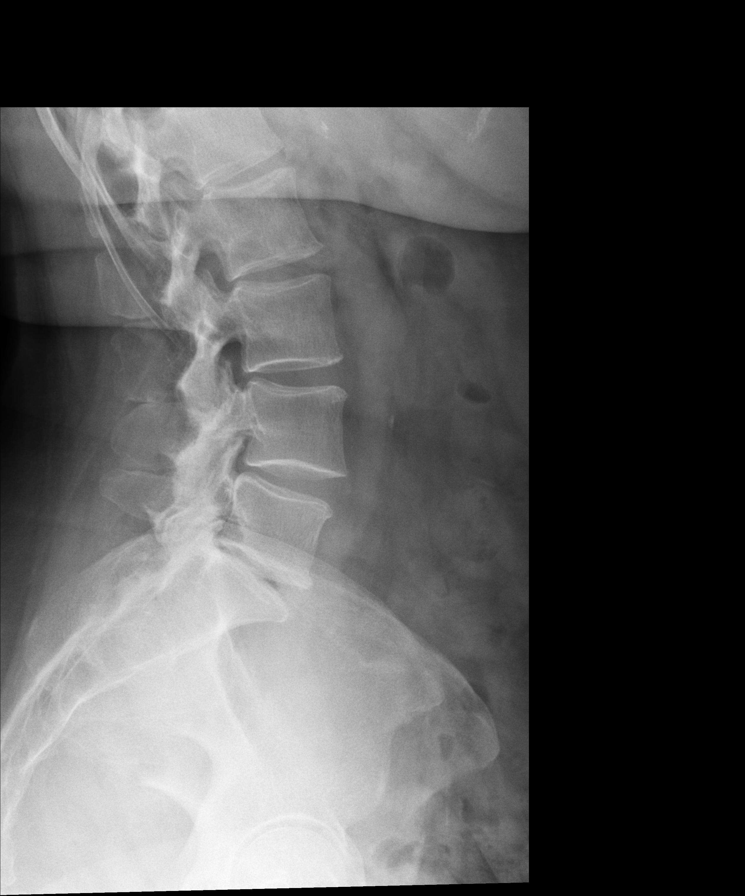

[8 of 10 positions shown; findings below may reference images not displayed]

FINDINGS: No lytic or blastic lesions. No pathologic fractures. Multifocal 
degenerative change. Dental hardware and fillings. Atherosclerosis.
IMPRESSION: No lytic or blastic lesions.

## 2021-06-16 IMAGING — PT PET CT SCAN TUMOR IMAGING SKULL TO THIGH  (FC
3 series · 25 of 25 positions shown · non-contrast
Comparison: MRI from May 27, 2021 and CT scan from May 15, 2021

________________________________________________________________________________________________ 
PET CT SCAN TUMOR IMAGING SKULL TO THIGH  (FC, 06/16/2021 [DATE]: 
CLINICAL INDICATION:  Chronic myeloproliferative disease. Previous abnormal 
imaging. Outside MRI of the thoracic spine described heterogeneous marrow.
TECHNIQUE: A dose of 15.1 millicuries of 18-FDG was administered intravenously 
and skull to thigh PET scanning was performed at 53 minutes. Tomographic scans 
were reconstructed in axial, coronal, and sagittal projections. The data was 
reconstructed into a three-dimensional volume rendered image and reviewed in a 
rotational cine loop. Serum blood glucose at the time of injection was 92 mg/dl.

[Series 3: pet ac · axial · 4.0mm · 4.07mm/px · z∈[+446,+1234]mm · 9 of 198 slices shown]
[im 1/198]
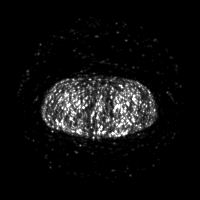
[im 25/198]
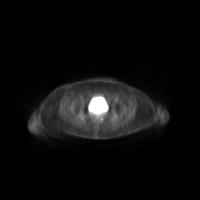
[im 50/198]
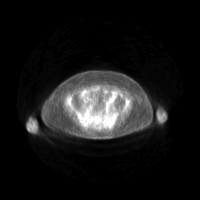
[im 74/198]
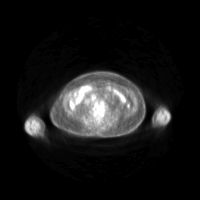
[im 99/198]
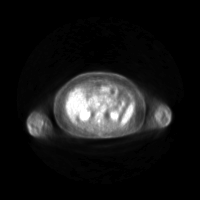
[im 124/198]
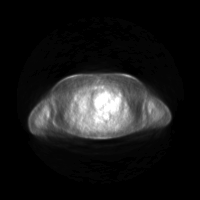
[im 148/198]
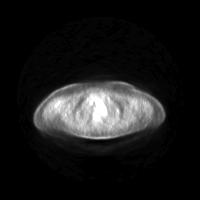
[im 173/198]
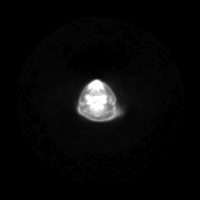
[im 198/198]
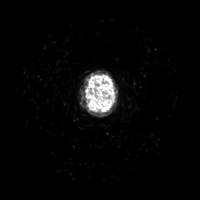

[Series 4: ct wb · axial · 4.0mm · 0.98mm/px · z∈[+446,+1234]mm · 8 of 198 slices shown]
[im 1/198]
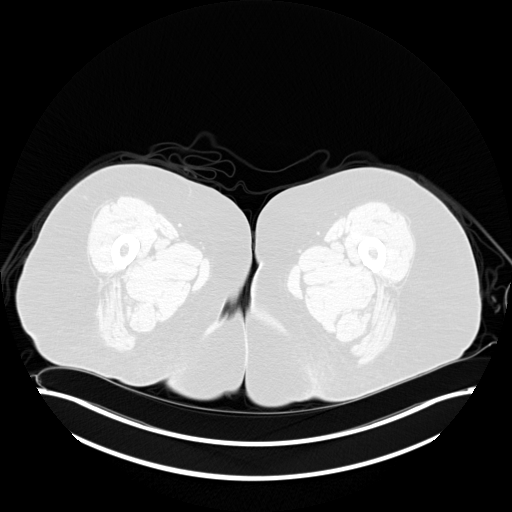
[im 29/198]
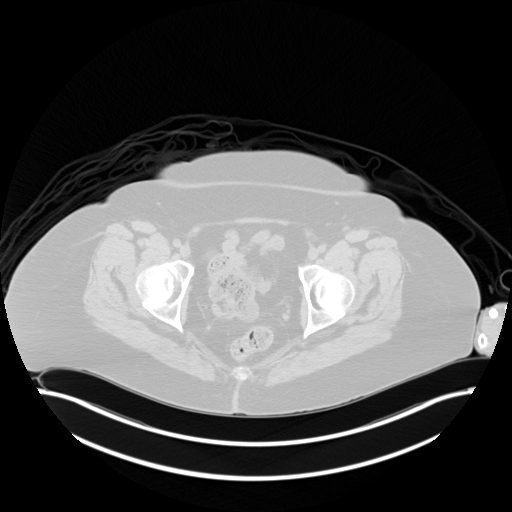
[im 57/198]
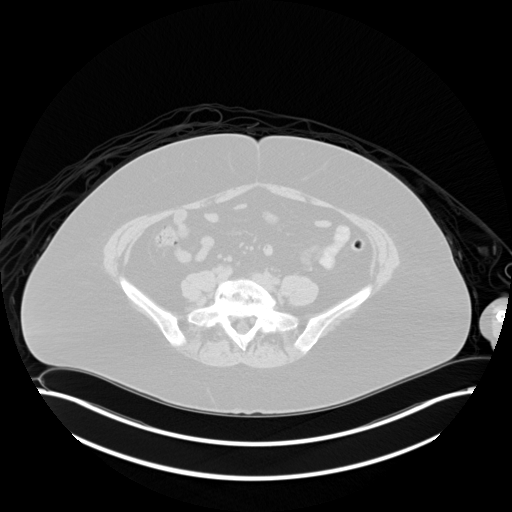
[im 85/198]
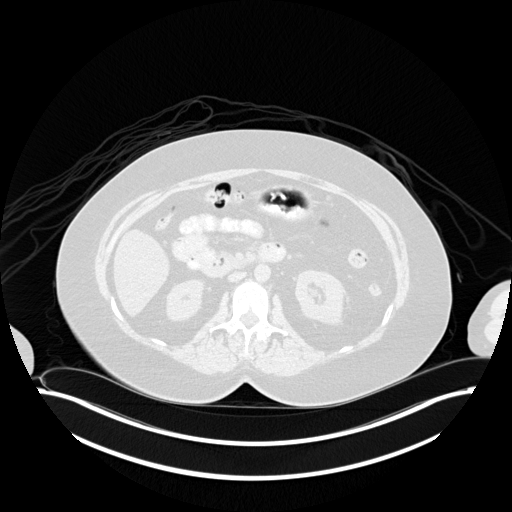
[im 113/198]
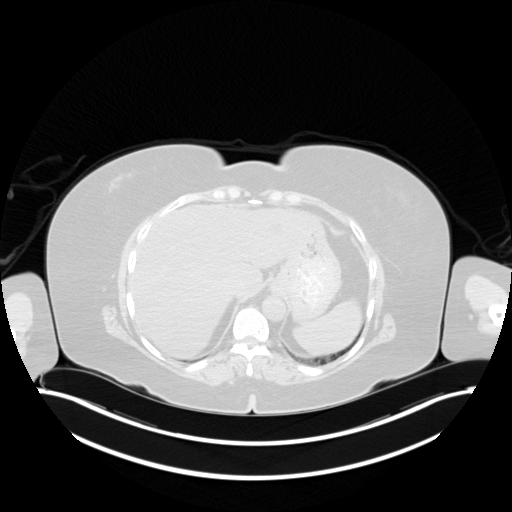
[im 141/198]
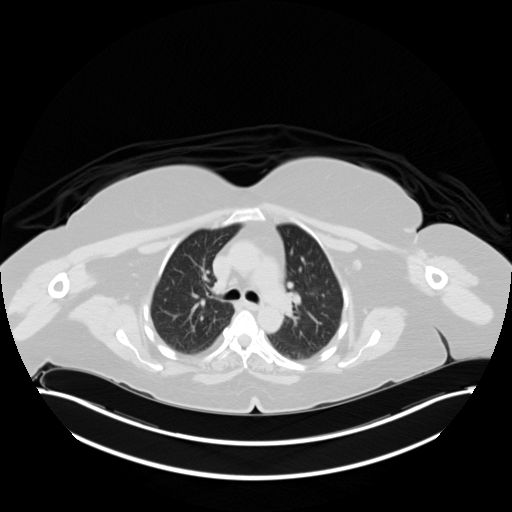
[im 169/198]
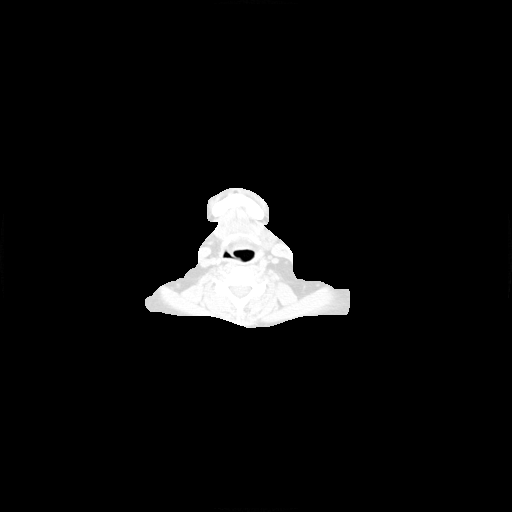
[im 198/198  brain]
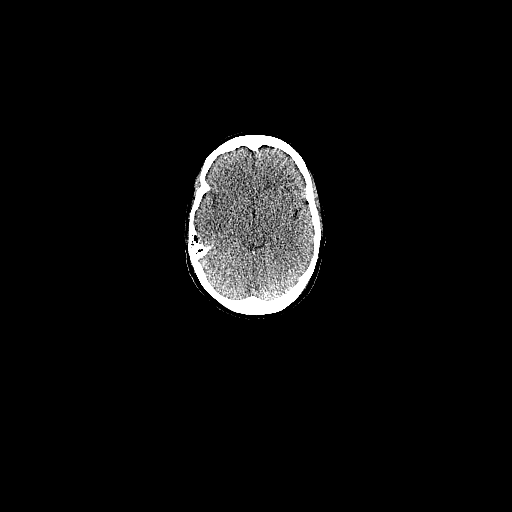

[Series 5: pet wb nac · axial · 4.0mm · 4.07mm/px · z∈[+446,+1234]mm · 8 of 198 slices shown]
[im 1/198]
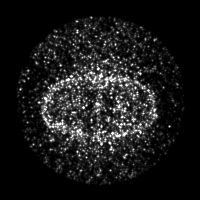
[im 29/198]
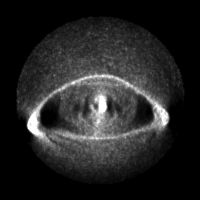
[im 57/198]
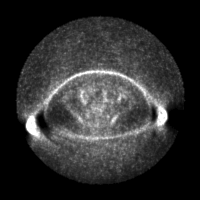
[im 85/198]
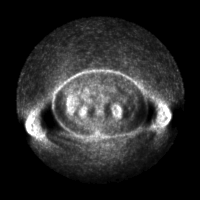
[im 113/198]
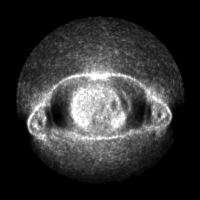
[im 141/198]
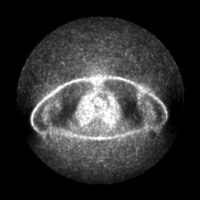
[im 169/198]
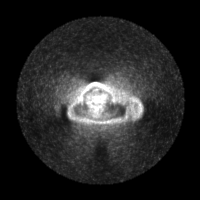
[im 198/198]
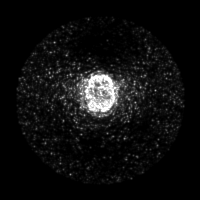

[25 of 25 positions shown; findings below may reference images not displayed]

FINDINGS: NECK/CHEST: There is a subtle focus of activity involving the superior aspect of 
the left lobe of the thyroid maximum SUV of 4.3. I do not see a good CT 
correlate. Would recommend sonography of the thyroid gland to better evaluate. 
No suspicious activity elsewhere within the neck or chest. 
ABDOMEN/PELVIS: No abnormal FDG uptake. 
MUSCULOSKELETAL: No abnormal FDG uptake. 
ADDITIONAL CT FUSION FINDINGS: Degenerative changes. Simple cyst left lobe 
liver. Suspect mild fatty infiltration liver.
IMPRESSION: Activity identified involving left lobe thyroid. Recommend sonography to better 
evaluate. Otherwise unremarkable exam. No PET evidence to suggest malignancy 
identified.

## 2021-09-11 IMAGING — MG MAMMOGRAPHY SCREENING BILATERAL 3[PERSON_NAME]
8 series · 8 of 24 positions shown · non-contrast
Comparison: Comparison was made to prior examinations.

________________________________________________________________________________________________ 
******** ADDENDUM #1 ********/n 
(BI-RADS 2) Benign findings. Routine mammographic follow-up is recommended. 
(Copy from previous report) 
MAMMOGRAPHY SCREENING BILATERAL 3GIACUMIN BEJENARU, 09/11/2021 [DATE]: 
CLINICAL INDICATION: Encounter for screening mammogram.
TECHNIQUE: Digital bilateral mammograms and 3-D Tomosynthesis were obtained. 
These were interpreted both primarily and with the aid of computer-aided 
detection system.  
BREAST DENSITY: (Level B) There are scattered areas of fibroglandular density.

[L MLO]
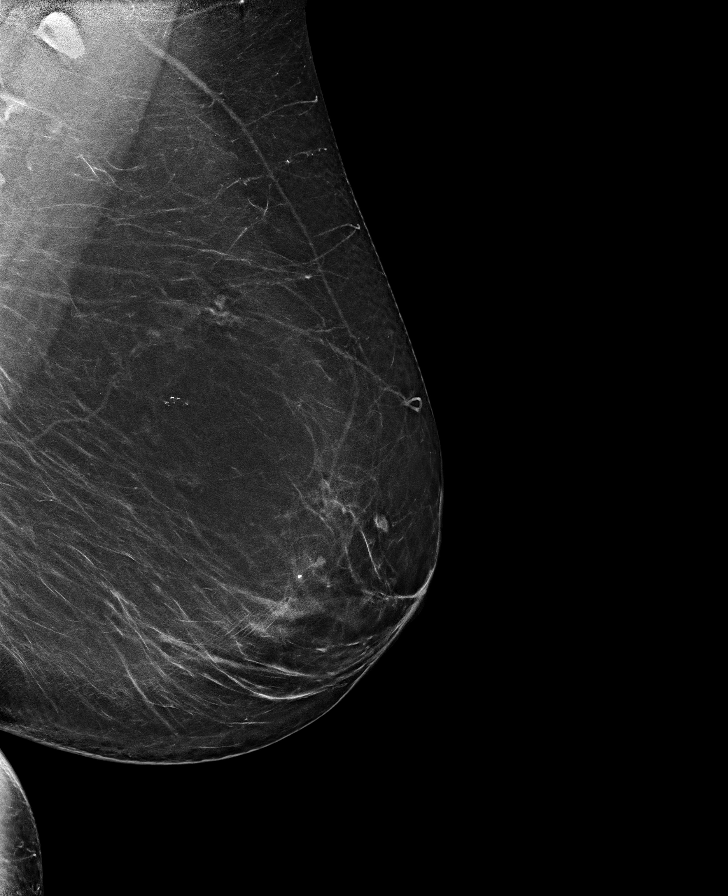

[L CC]
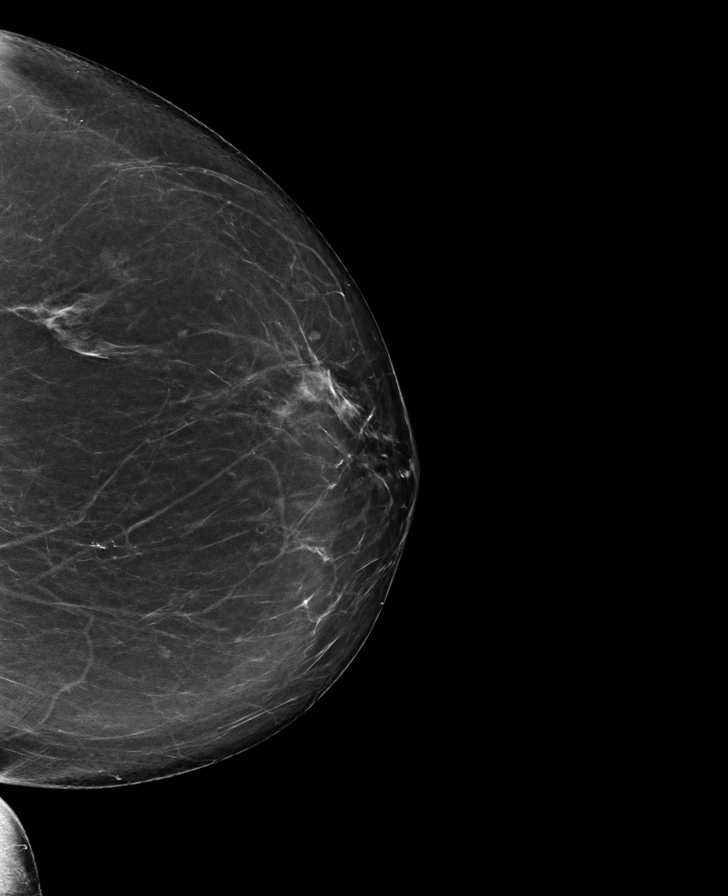

[R MLO]
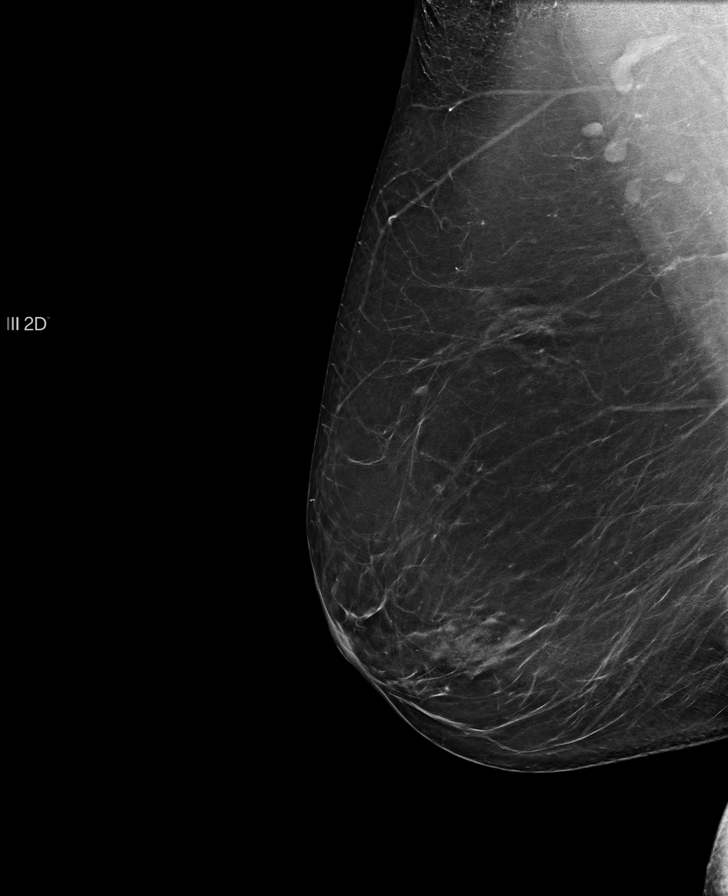

[R CC]
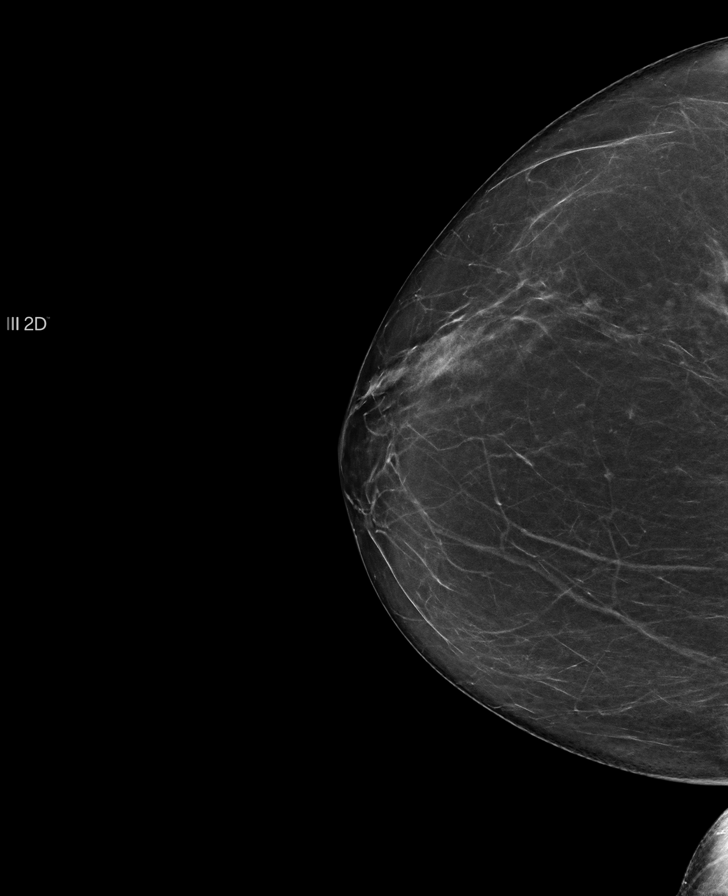

[R MLO tomo · tomo slice 41/81.0]
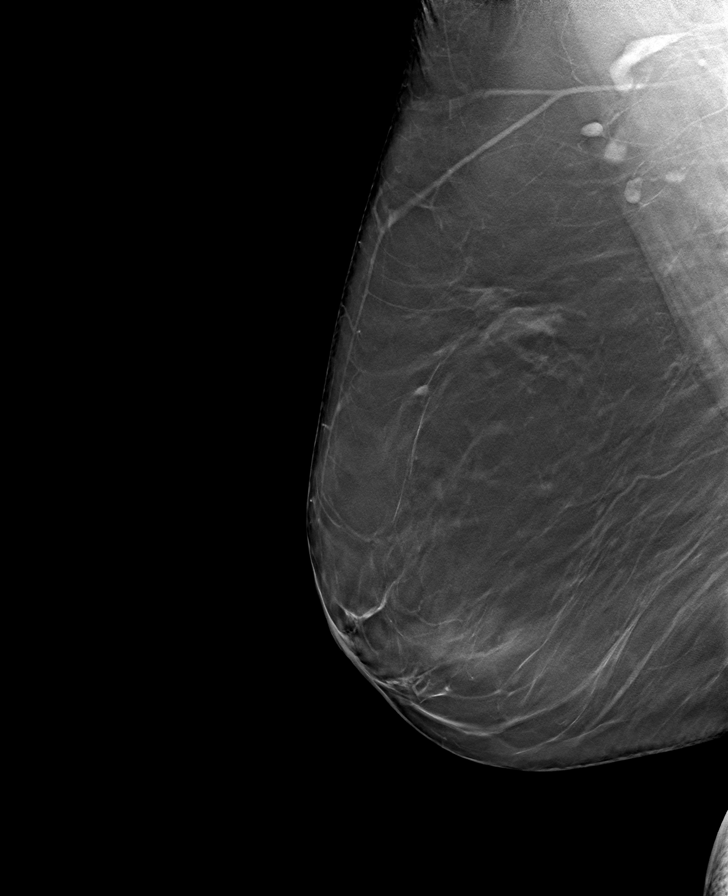

[L CC tomo · tomo slice 37/72.0]
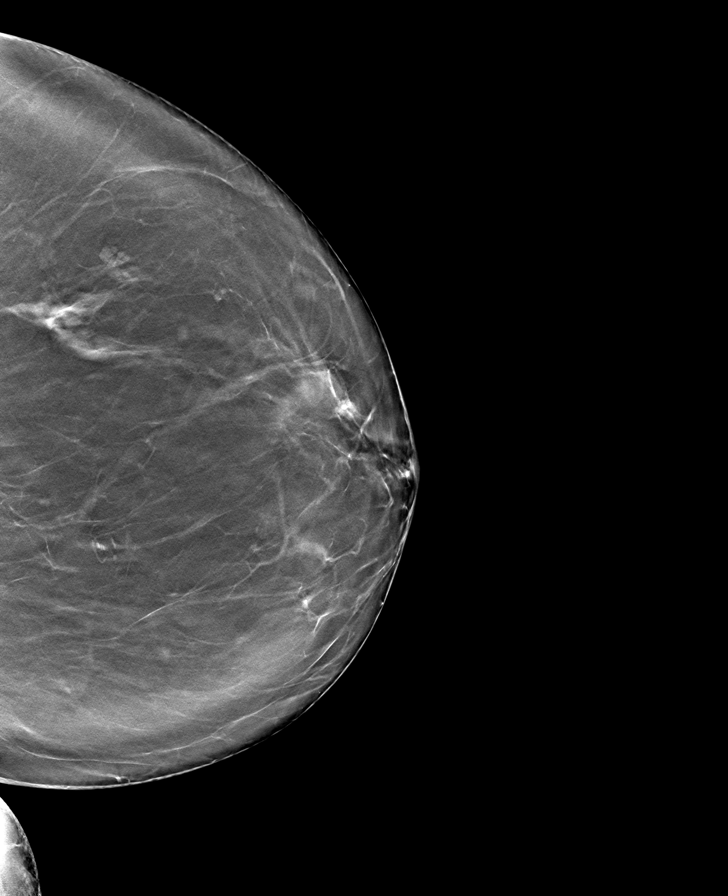

[R CC tomo · tomo slice 33/64.0]
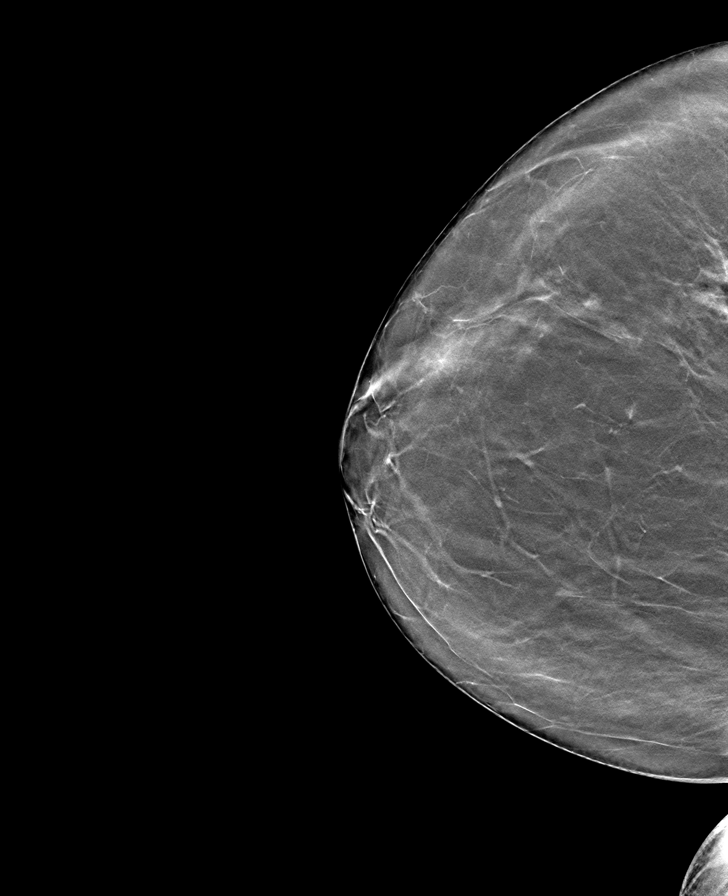

[L MLO tomo · tomo slice 42/83.0]
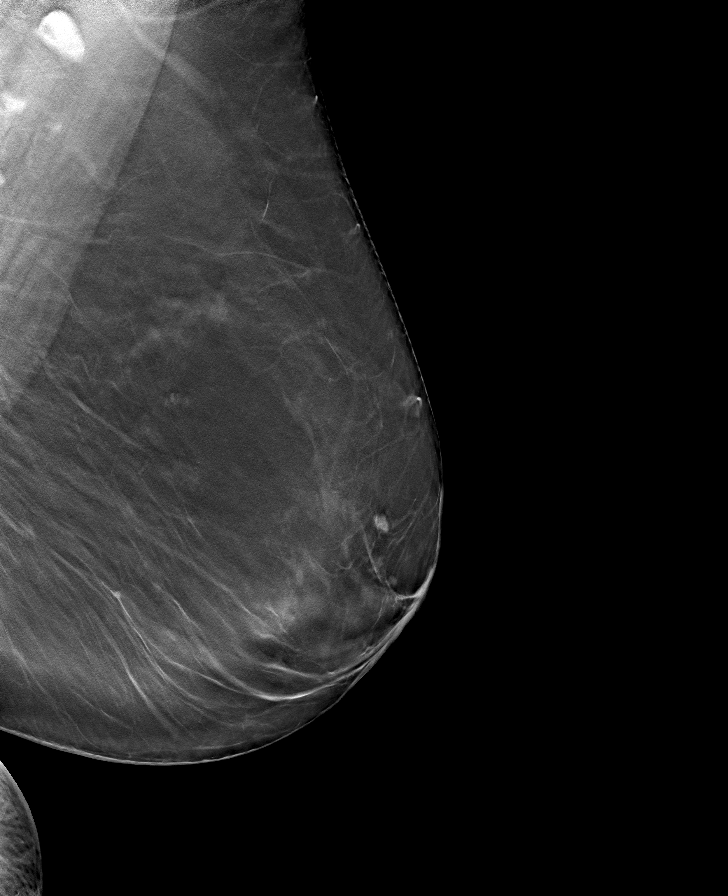

[8 of 24 positions shown; findings below may reference images not displayed]

FINDINGS: Mild stable well-circumscribed nodularity in the left breast which has 
waxed and waned. No new suspicious mass, calcifications, or area of 
architectural distortion in either breast.
IMPRESSION: (BI-RADS 2) Benign findings. Routine mammographic follow-up is recommended.

## 2022-06-11 IMAGING — DX PELVIS 1 VIEW
1 series · 1 of 1 positions shown · non-contrast
Comparison: 06/16/2021 PET/CT

________________________________________________________________________________________________ 
PELVIS 1 VIEW, SACROILIAC JOINT 3 VIEWS, 06/11/2022 [DATE]: 
CLINICAL INDICATION: Assess for inflammatory arthritis.

[AP]
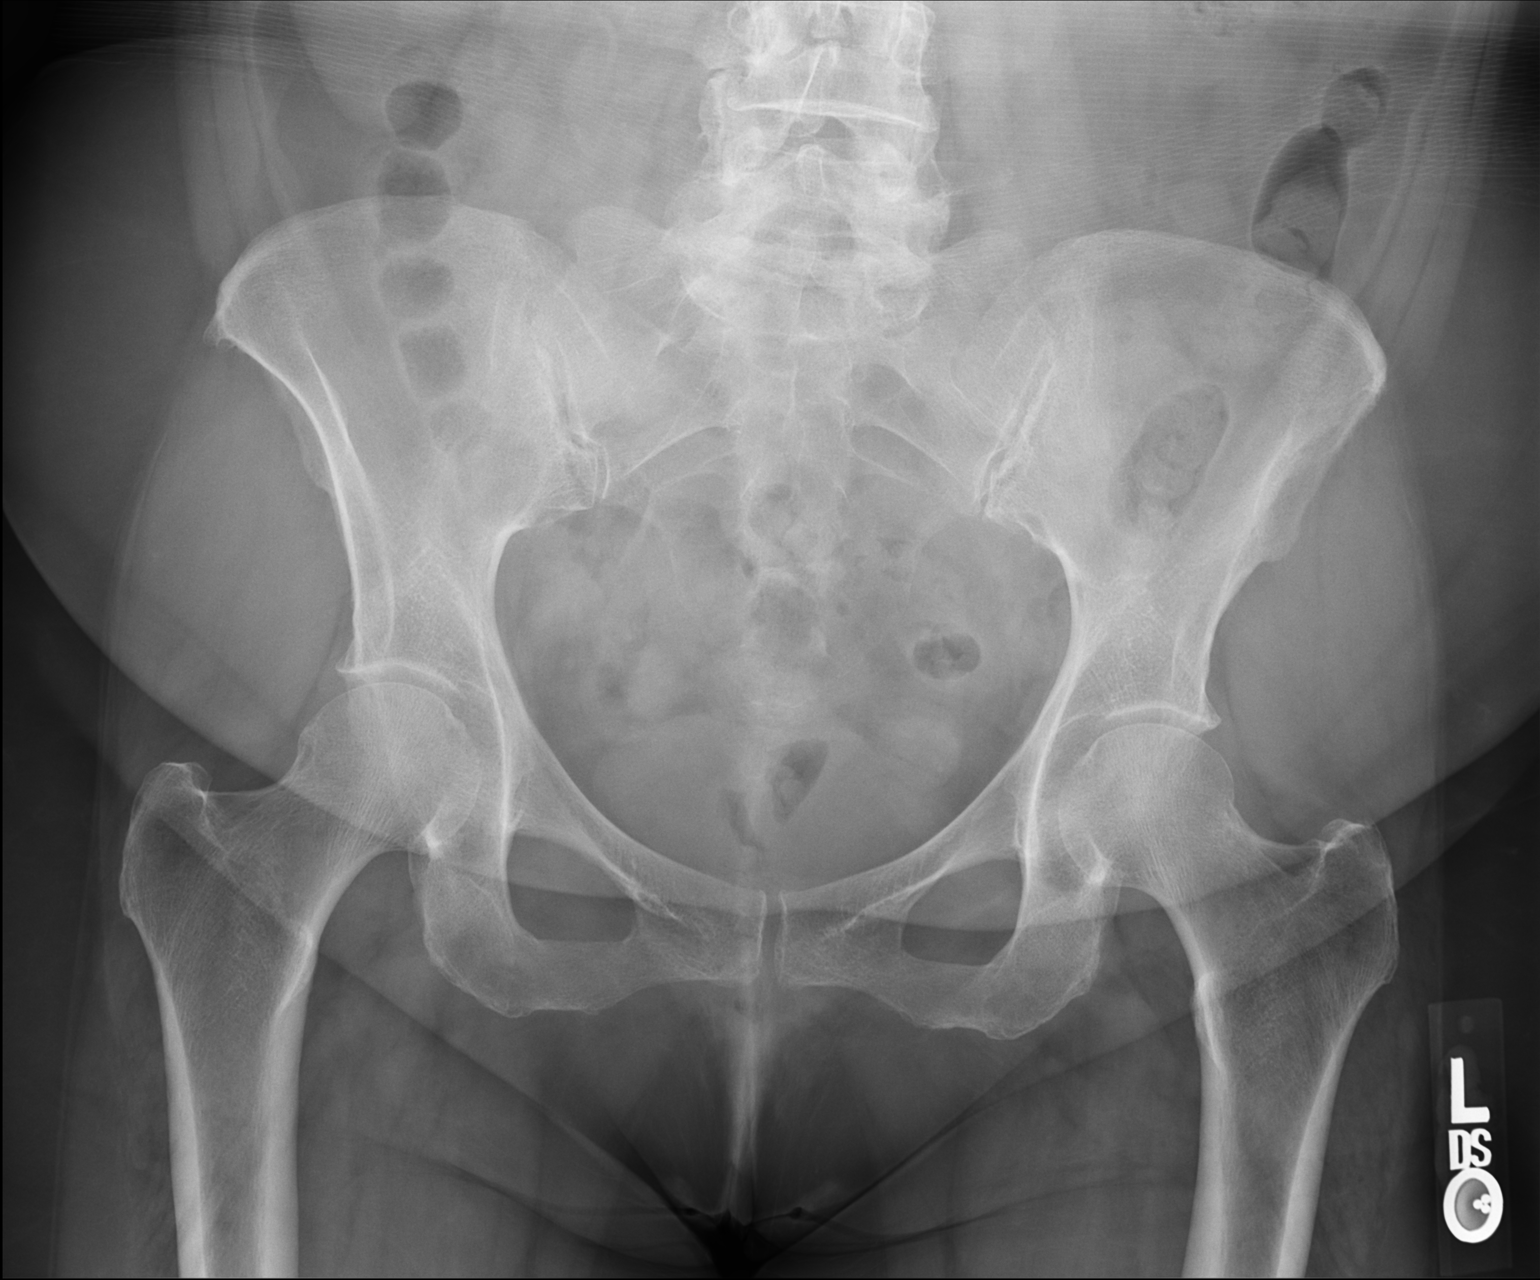

[1 of 1 positions shown; findings below may reference images not displayed]

FINDINGS: No fracture. Normal alignment. Hip joint space heights are preserved. 
Tiny right lateral femoral neck and bilateral acetabular osteophytes. 
Degenerative change of the spine. Osteopenia. No focal soft tissue swelling.
IMPRESSION: Mild degenerative change and osteopenia.

## 2022-06-11 IMAGING — DX SACROILIAC JOINT 3 VIEWS
1 series · 3 of 3 positions shown · non-contrast
Comparison: 06/16/2021 PET/CT

________________________________________________________________________________________________ 
PELVIS 1 VIEW, SACROILIAC JOINT 3 VIEWS, 06/11/2022 [DATE]: 
CLINICAL INDICATION: Assess for inflammatory arthritis.

[Series 1: AP · U · 0.14mm/px · 3 of 3 slices shown]
[im 1/3]
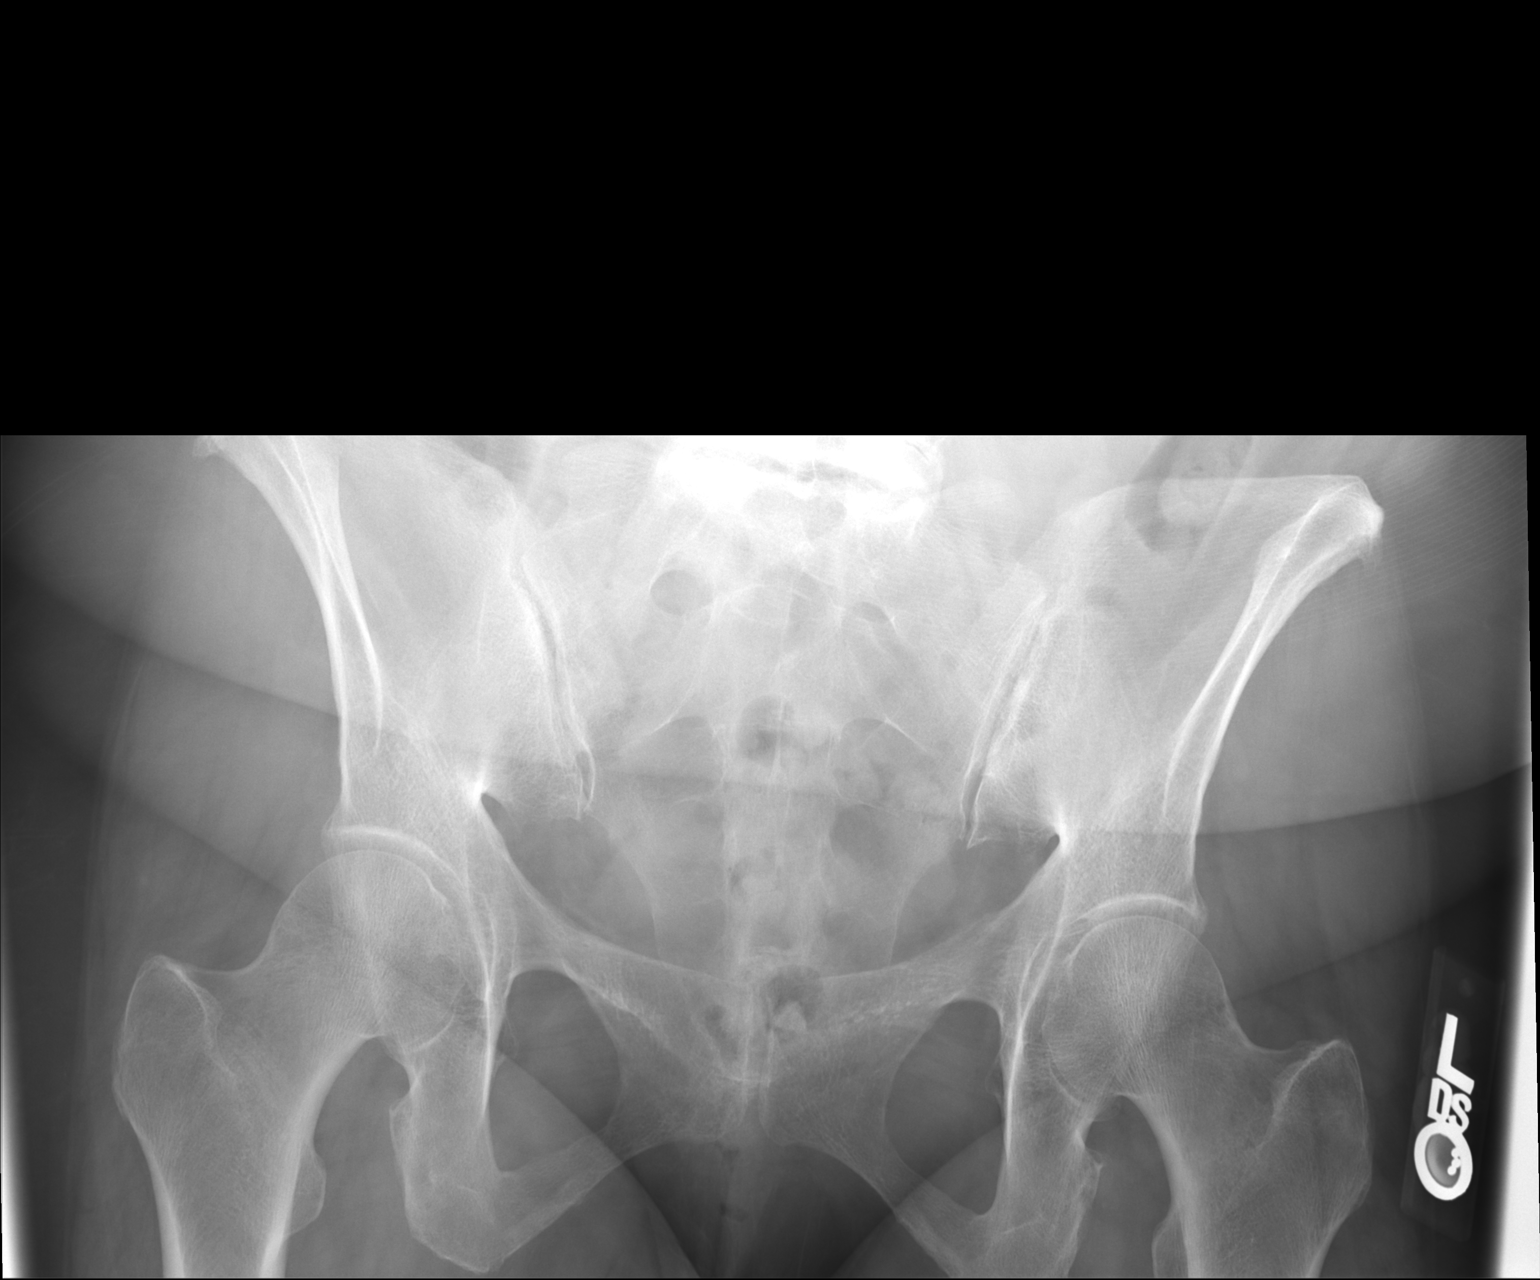
[im 2/3]
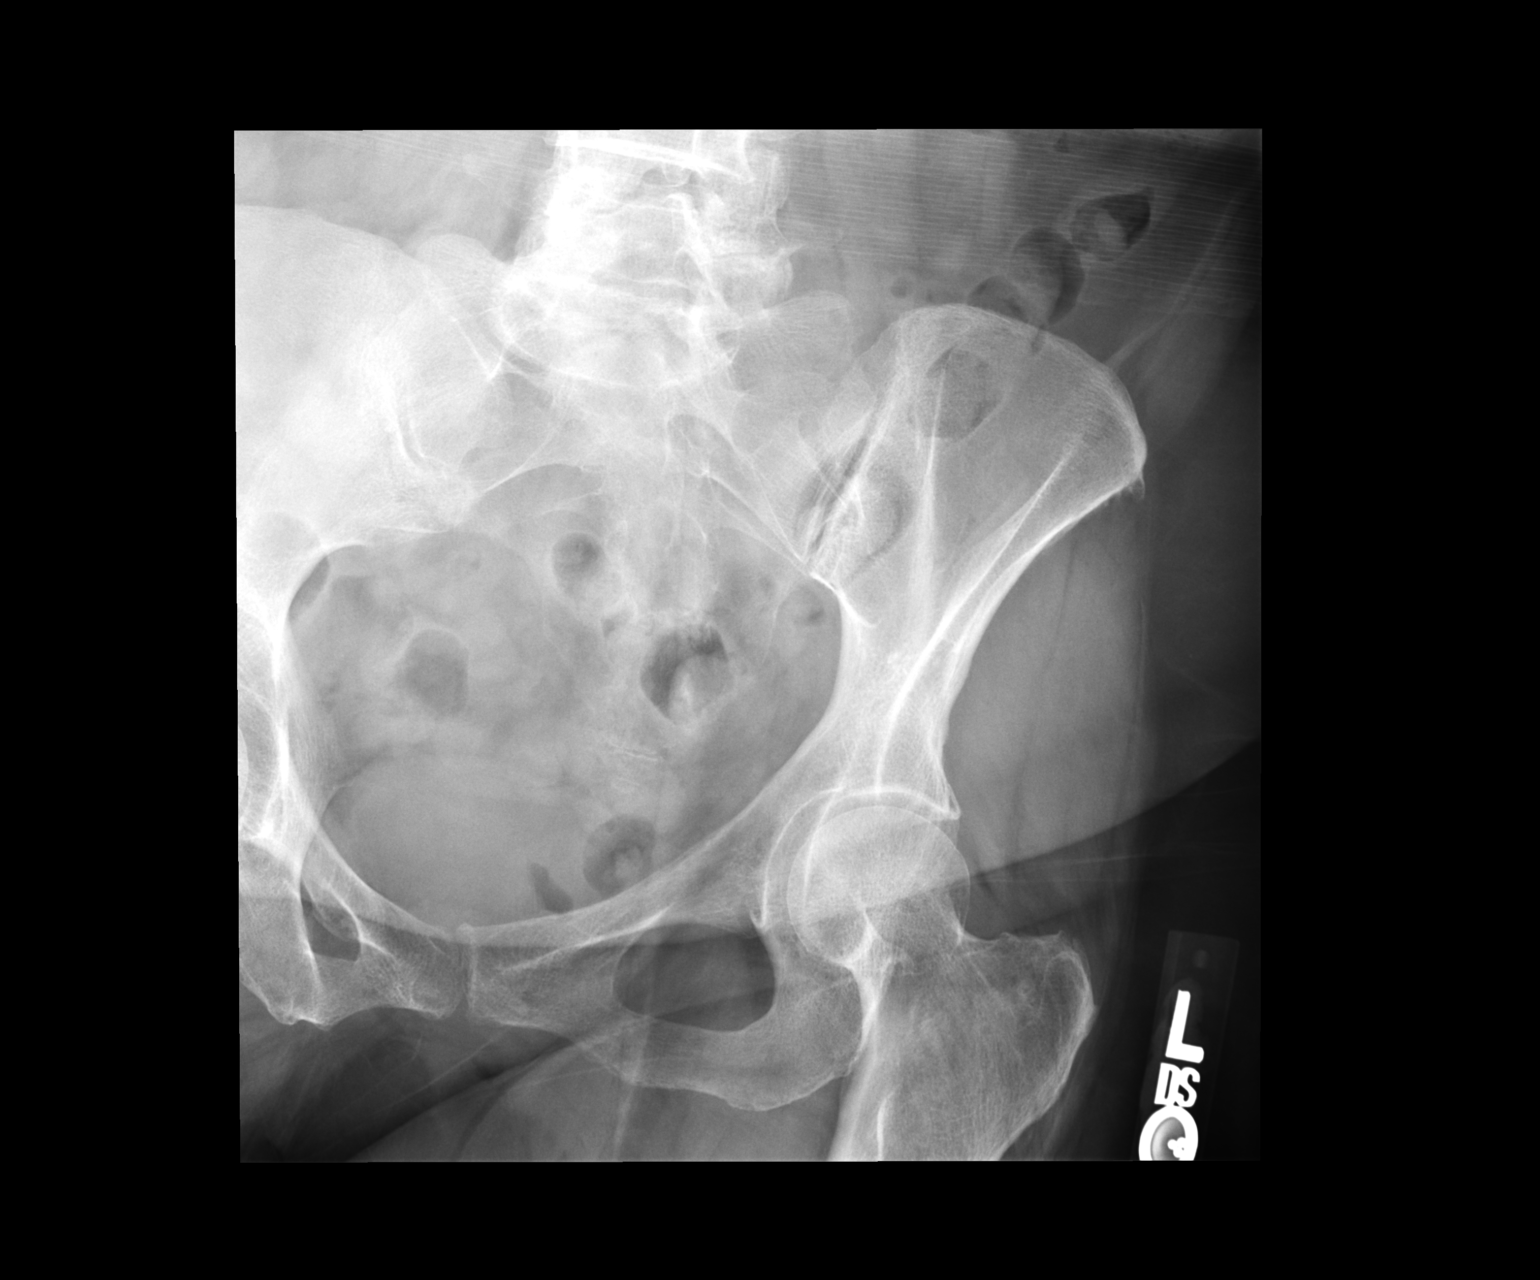
[im 3/3]
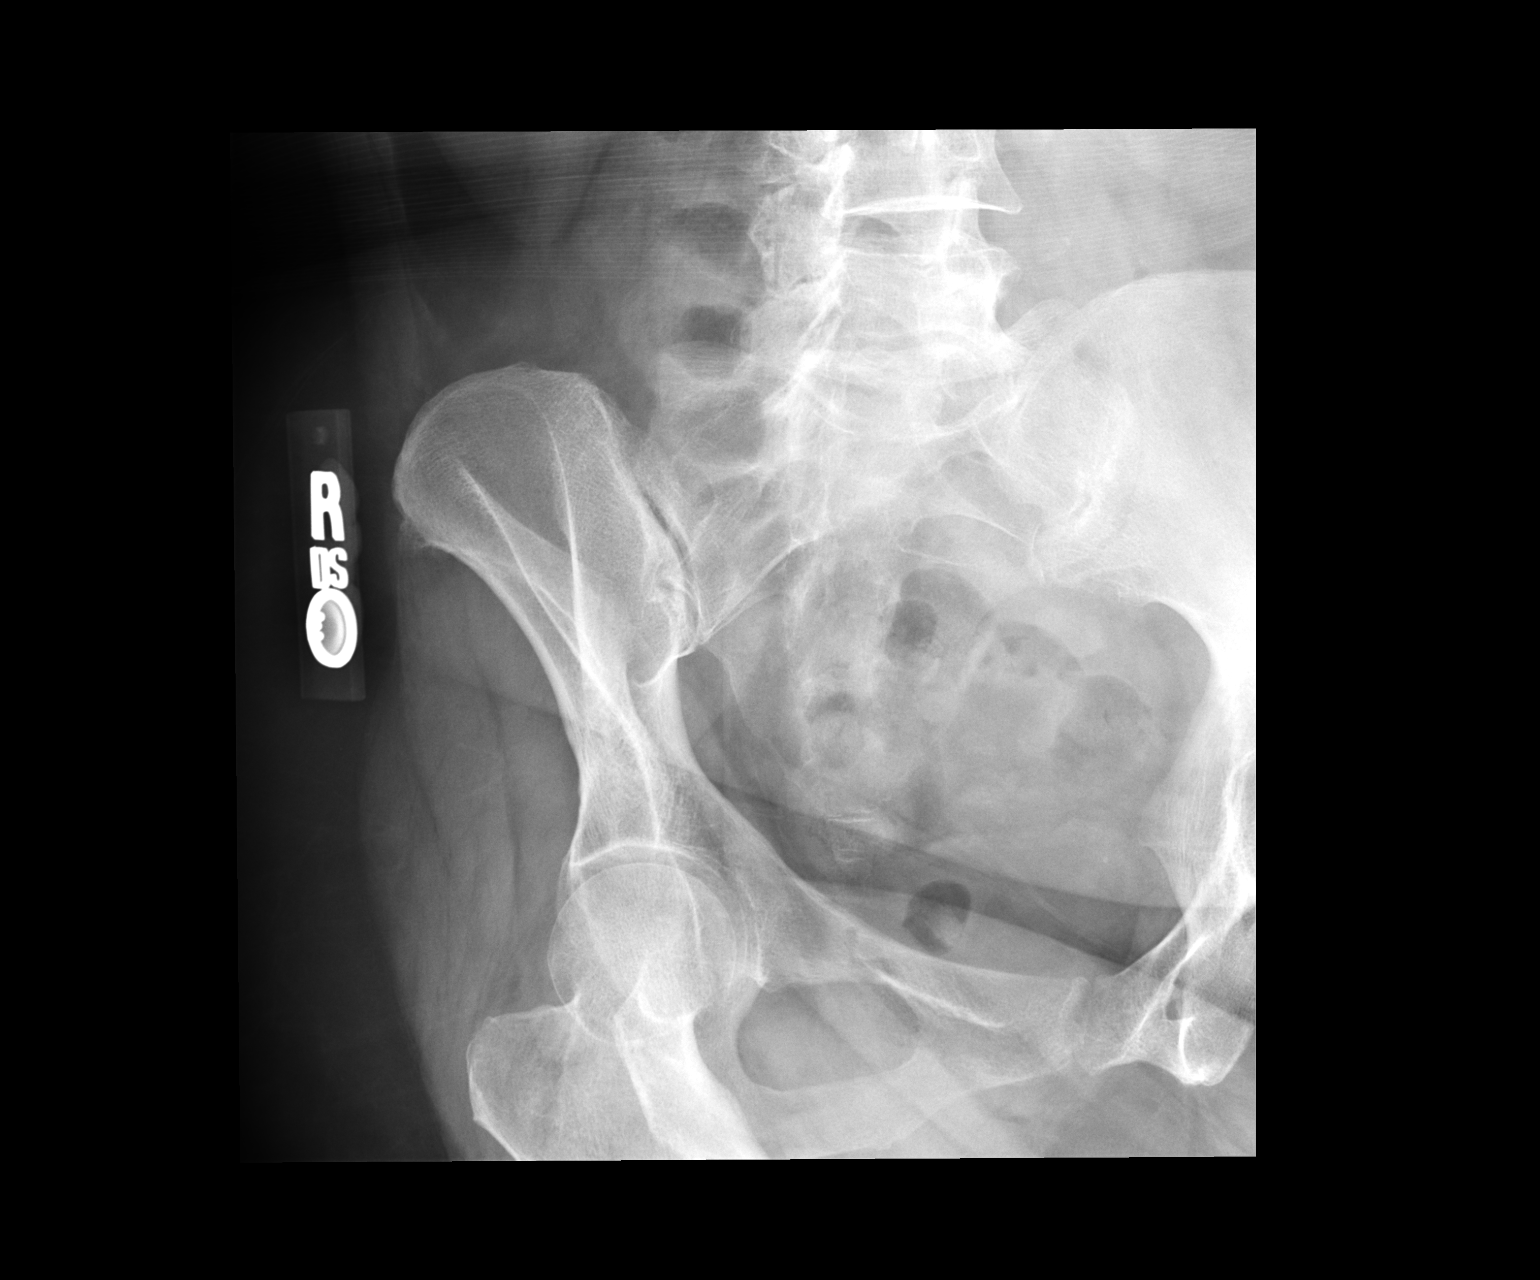

[3 of 3 positions shown; findings below may reference images not displayed]

FINDINGS: No fracture. Normal alignment. Hip joint space heights are preserved. 
Tiny right lateral femoral neck and bilateral acetabular osteophytes. 
Degenerative change of the spine. Osteopenia. No focal soft tissue swelling.
IMPRESSION: Mild degenerative change and osteopenia.

## 2022-07-15 IMAGING — MR MRI PELVIS W/WO CONTRAST
4 of 14 series · 8 of 48 positions shown · IV contrast (gadavist)
Comparison: Radiographs of 06/11/2022. PET CT exam of 06/16/2021. MR examination of 
the thoracic spine of 05/27/2021.

________________________________________________________________________________________________ 
MRI PELVIS W/WO CONTRAST, 07/15/2022 [DATE]: 
CLINICAL INDICATION: Chronic sacral pain. No radiation. Evaluate for bone 
pathology or sacroiliitis. Chronic myeloproliferative disease.
TECHNIQUE: Multiplanar, multiecho position MR images of the pelvis were 
performed without and with 8.5 mL of  Gadavist were injected intravenously by 
hand. 1.5 mL was discarded.. Patient was scanned on a 1.5T magnet.

[Series 201: survey · axial · 10.0mm · 1.25mm/px · 1 of 10 slices shown]
[im 1/10]
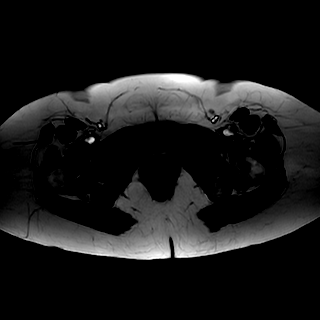

[Series 301: t1_(person_name) · axial · 6.0mm · 0.41mm/px · z∈[-137,+143]mm · 3 of 36 slices shown]
[im 1/36]
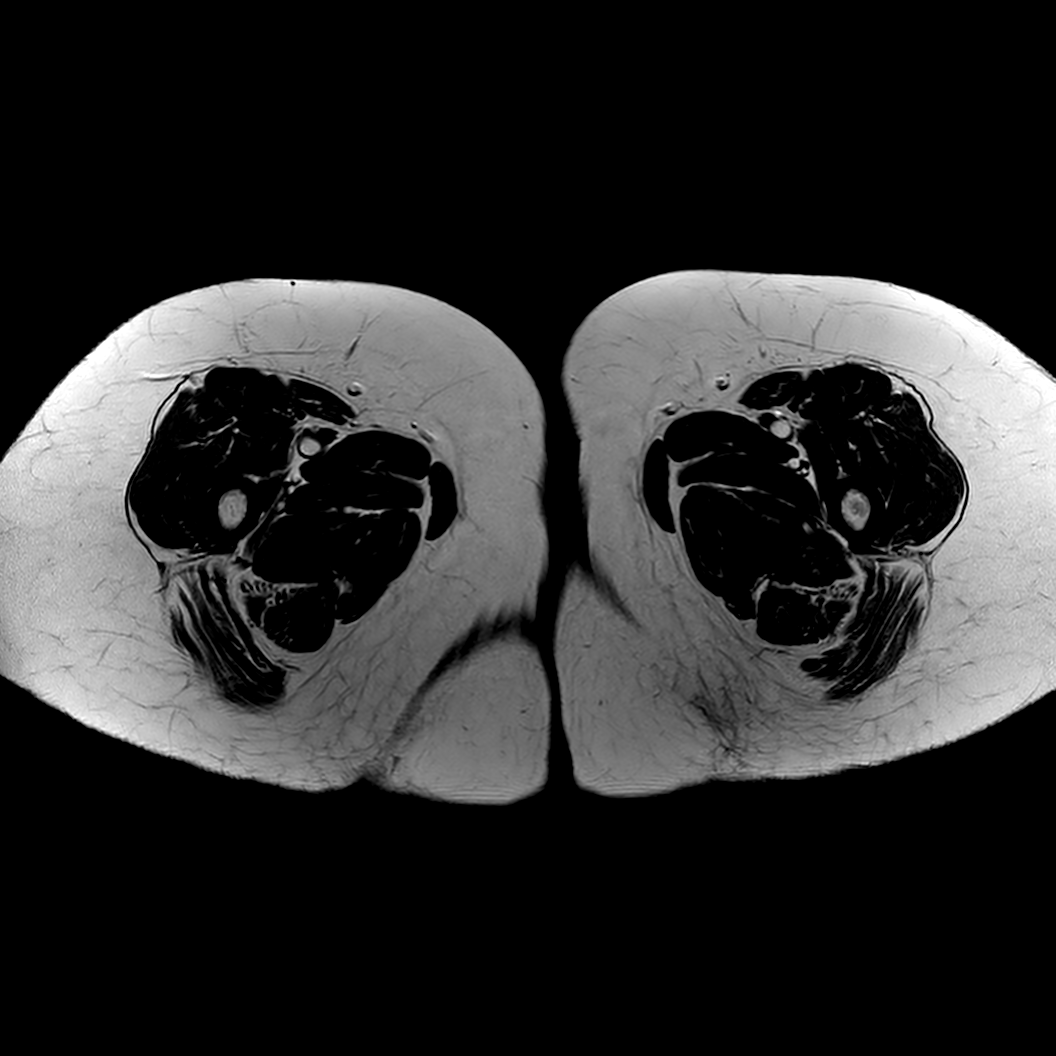
[im 18/36]
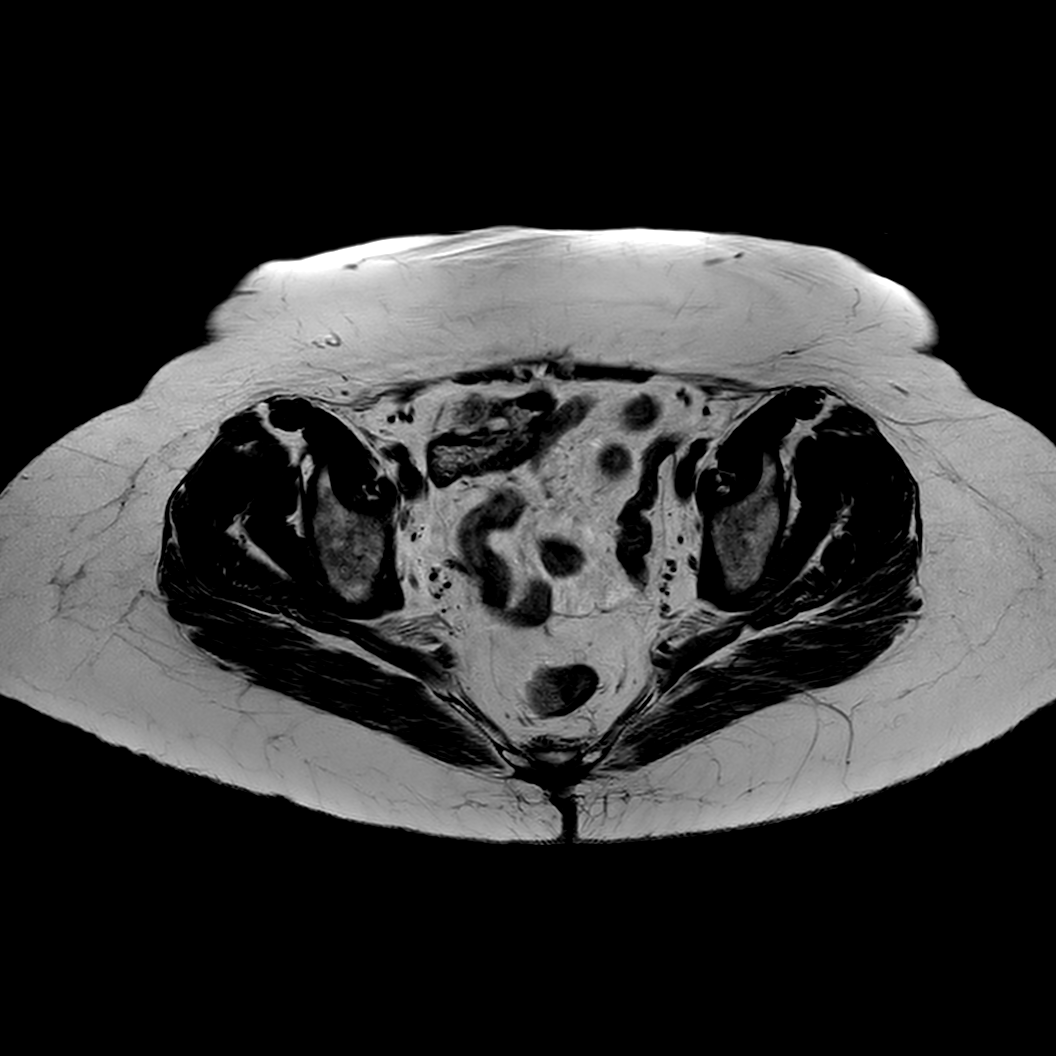
[im 36/36]
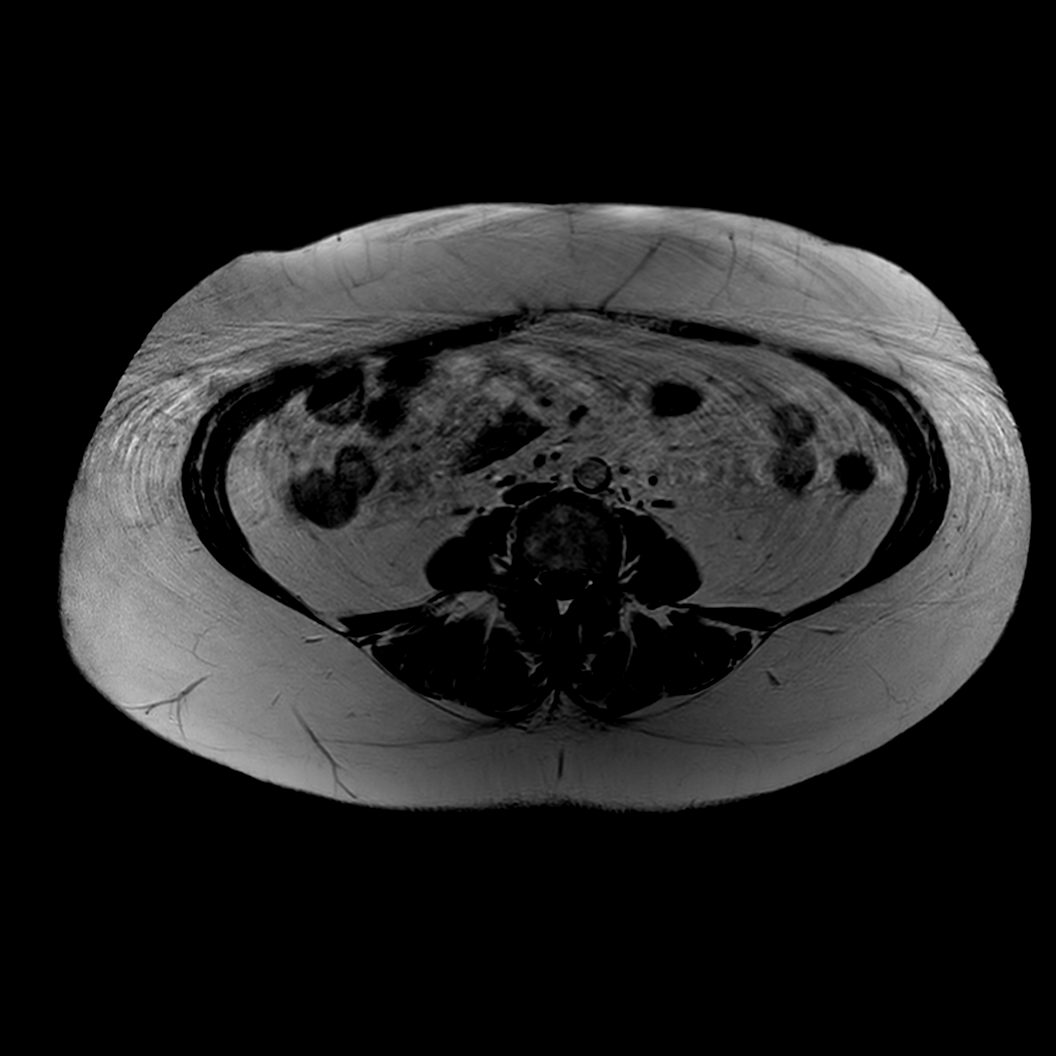

[Series 401: (person_name)_(person_name)_(person_name) · axial · 6.0mm · 0.67mm/px · z∈[-136,+143]mm · 3 of 36 slices shown]
[im 1/36]
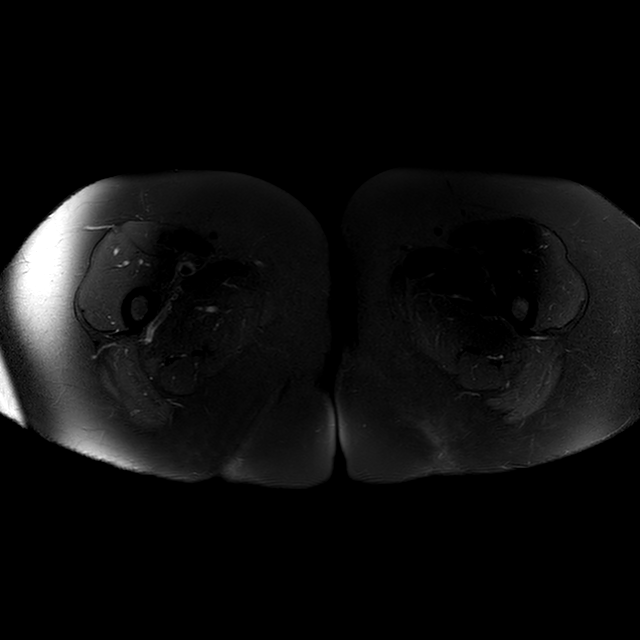
[im 18/36]
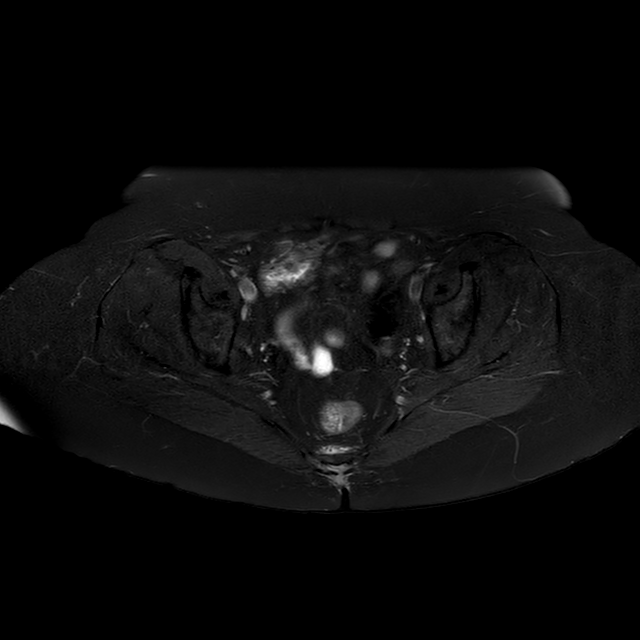
[im 36/36]
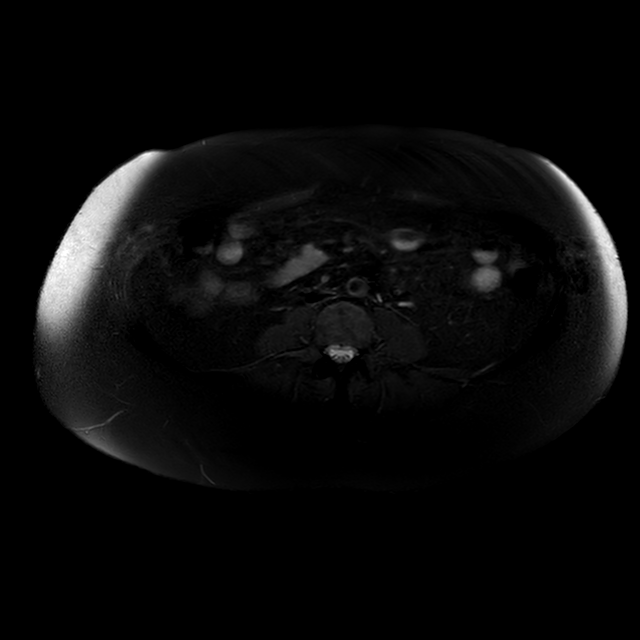

[Series 501: t1_cor · coronal · 5.0mm · 0.59mm/px · 1 of 30 slices shown]
[im 1/30]
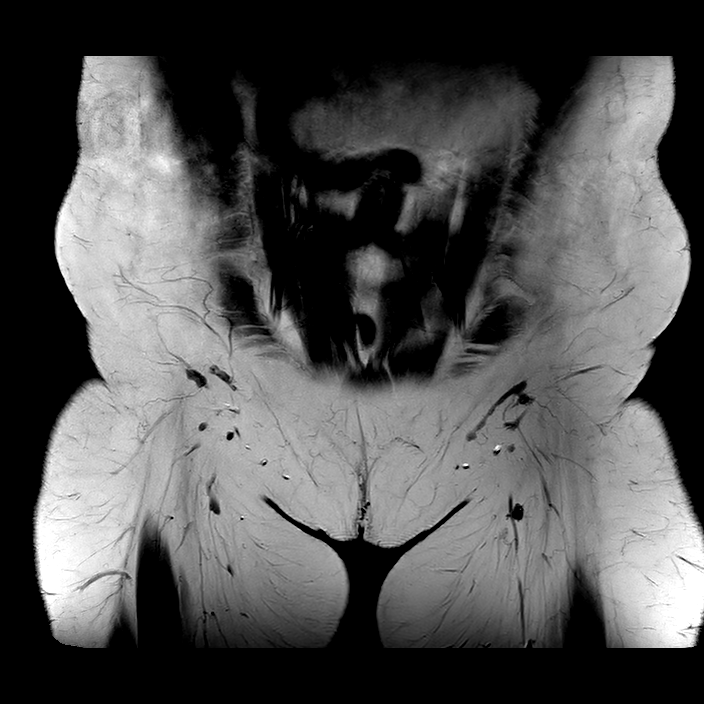

[8 of 48 positions shown; findings below may reference images not displayed]

FINDINGS: Abnormal appearance of the distal sacrum, adjacent coccyx and coccygeal tip. 
This is new since the prior exams. There is decreased T1 and increased proton 
density signal with increased enhancement. There is suggestion of pathologic 
fracture at the sacrococcygeal level. These findings are seen for example on the 
sagittal series, series 701, image 27. There is surrounding soft tissue and 
periosteal enhancement. The lesion involving the distal sacrum and adjacent 
coccyx involves an SI distance of approximately 2 cm measuring 1.8 cm AP and the 
coccygeal tip lesion measures 1.65 cm SI by 0.7 cm AP. 
There is surrounding soft tissue fluid signal intensity and increased 
enhancement in the adjacent soft tissues. There is a lobulated area of decreased 
enhancement dorsal to the sacrococcygeal lesion as seen for example on series 
1341, image 25. Central decreased enhancement measures approximately 1.5 x 2 cm, 
series 3523, image 16. Abnormal soft tissue enhancement is contiguous to the 
skin surface of the midline gluteal cleft. 
SI joints are preserved. No erosion or ankylosis. No MR findings to suggest 
acute or chronic sacroiliitis. Degenerative changes included lower lumbar spine. 
Femoral heads are well located. Hip joint spaces appear preserved. No effusion. 
Intrapelvic bowel loops are negative. No lymphadenopathy. Intrapelvic bowel 
loops are negative.
IMPRESSION: Abnormal appearance of the distal sacrum, adjacent coccyx and coccygeal tip with 
focal marrow replacing lesions suggestive for metastatic disease or myeloma. 
There is pathologic fracture at the sacrococcygeal level. The surrounding soft 
tissue enhancement may be reactive, however superimposed infectious process 
cannot be excluded given its continuity with the gluteal cleft and focal area of 
nonenhancement with fluid signal intensity.

## 2022-10-04 IMAGING — CT CT CALCIUM SCORING
1 series · 15 of 20 positions shown, 19 images · non-contrast
Comparison: There are no previous exams available for comparison.

________________________________________________________________________________________________ 
CT CALCIUM SCORING, 10/04/2022 [DATE]:
INDICATION: Encounter for screening for cardiovascular disorders

[Series 2: calscore · axial · 0.36mm/px · z∈[-262,-140]mm · 15 of 91 slices shown, 19 images]
[im 5/91  vessel]
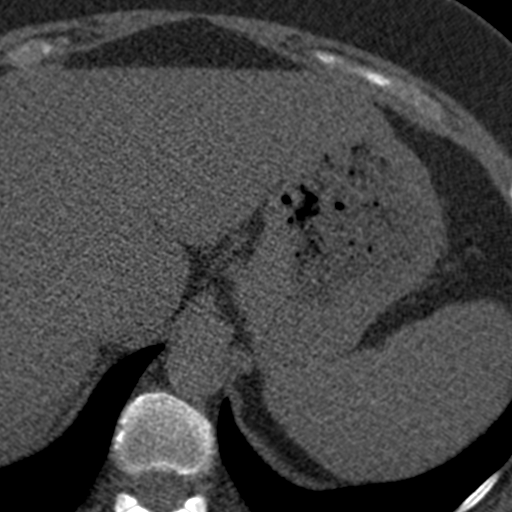
[im 5/91  lung]
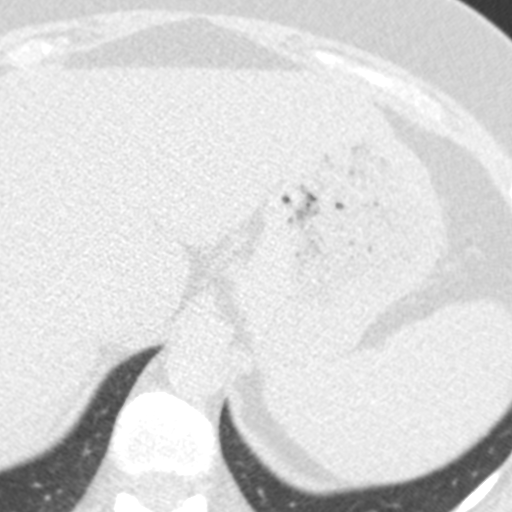
[im 10/91  vessel]
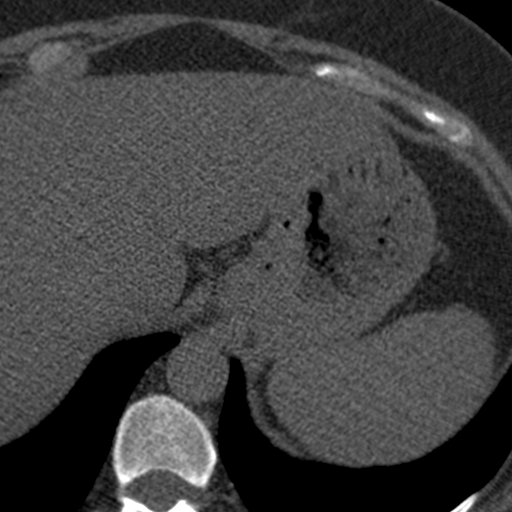
[im 19/91  vessel]
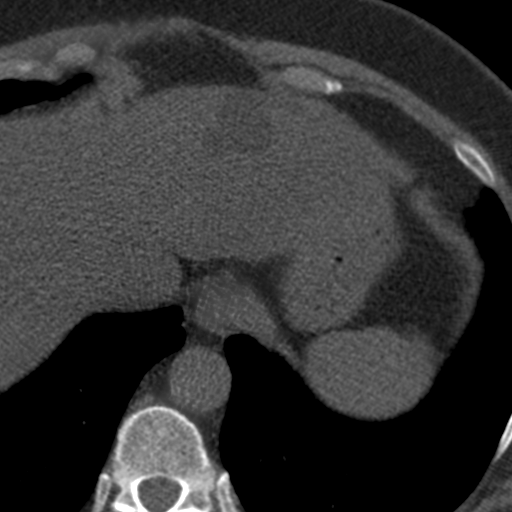
[im 24/91  vessel]
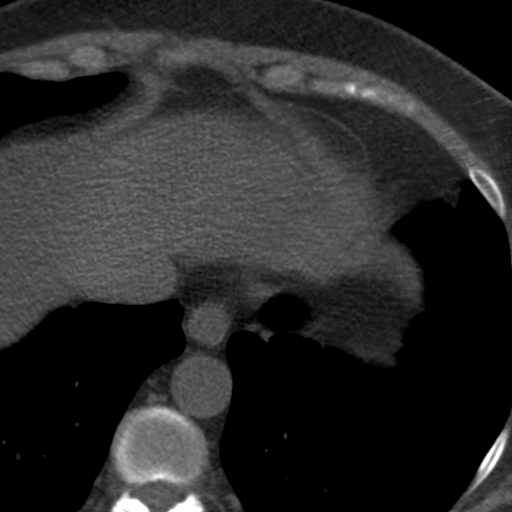
[im 29/91  vessel]
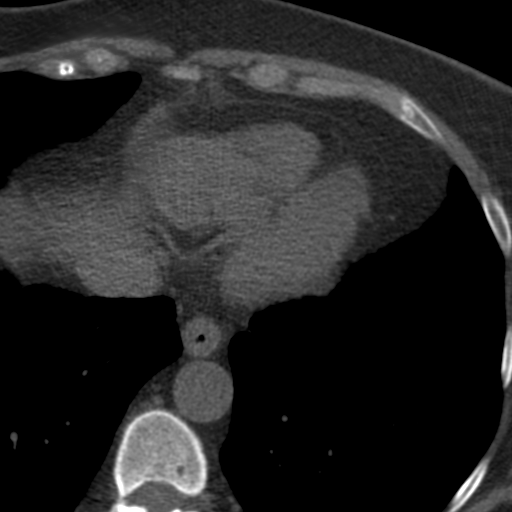
[im 29/91  lung]
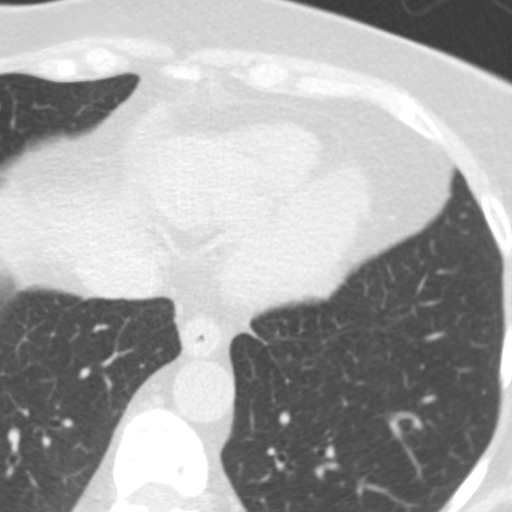
[im 34/91  vessel]
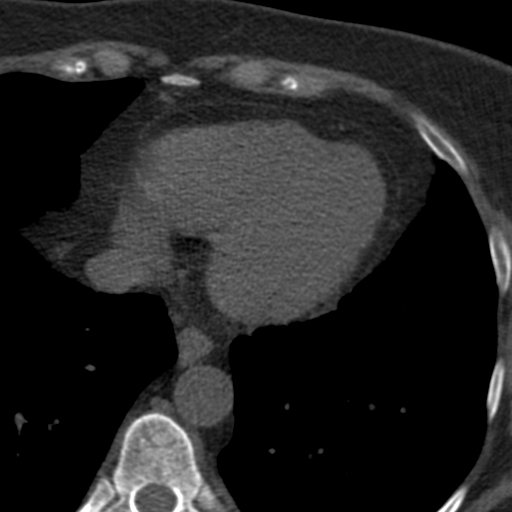
[im 38/91  vessel]
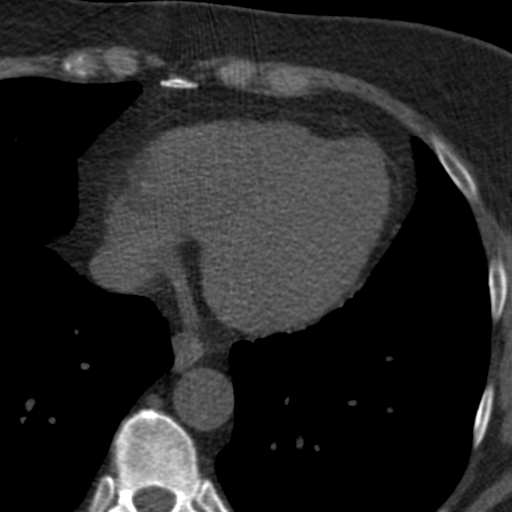
[im 48/91  vessel]
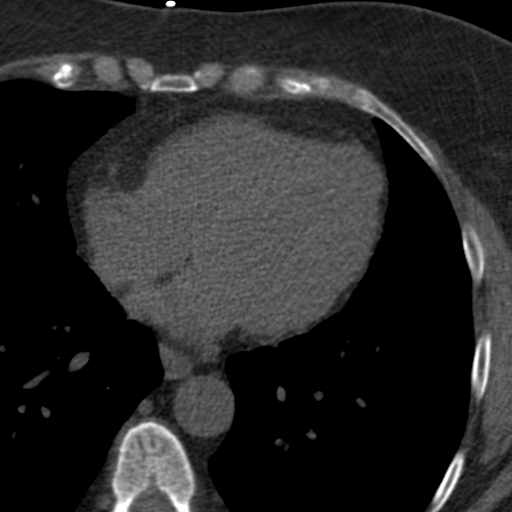
[im 53/91  vessel]
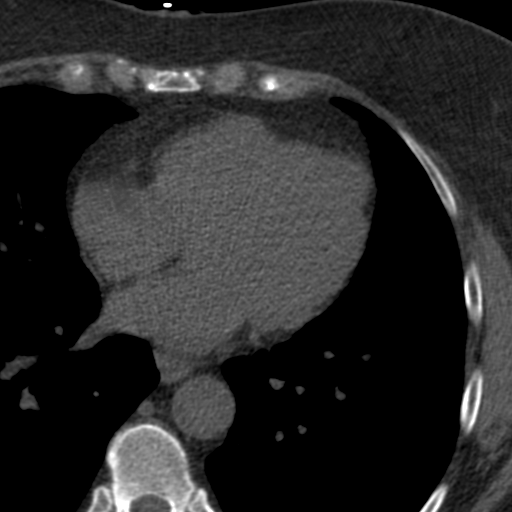
[im 53/91  lung]
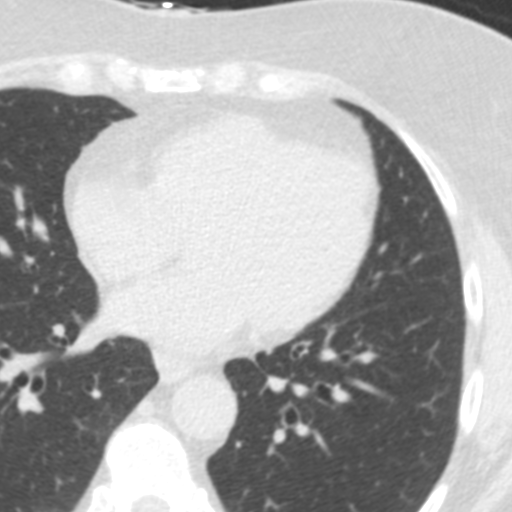
[im 57/91  vessel]
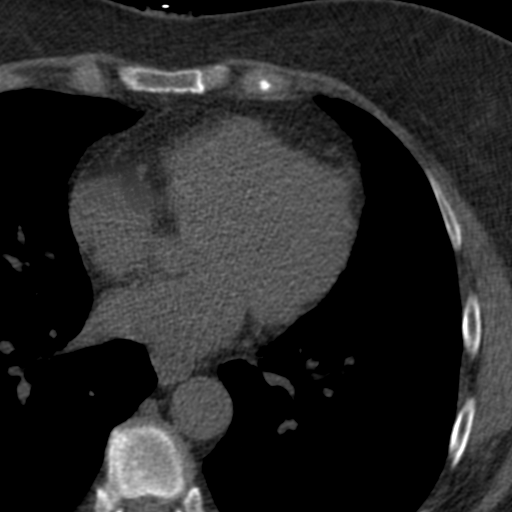
[im 62/91  vessel]
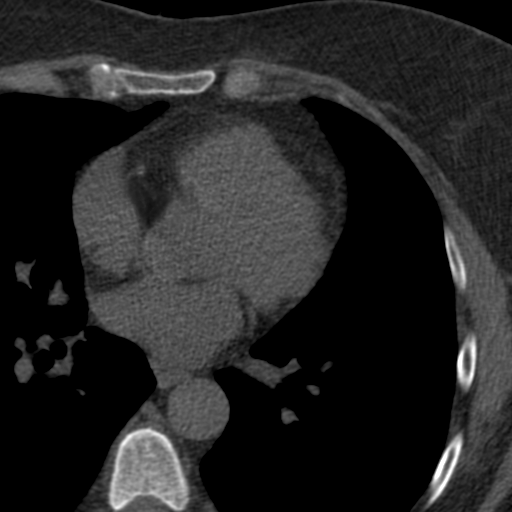
[im 67/91  vessel]
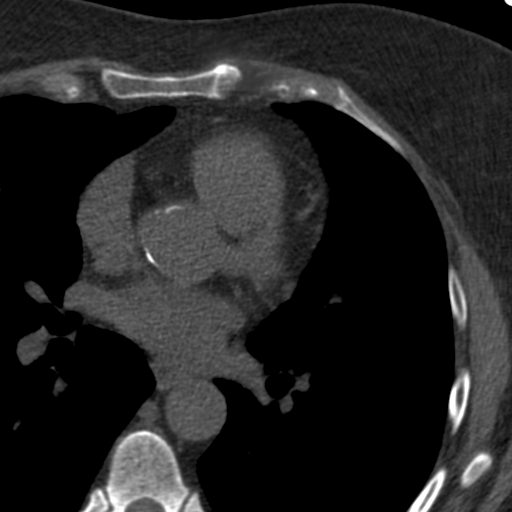
[im 76/91  vessel]
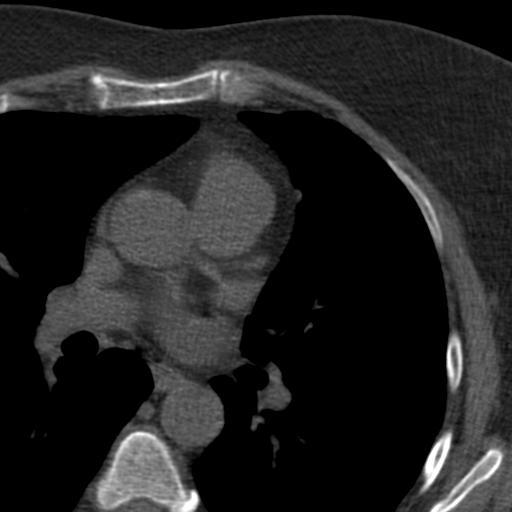
[im 76/91  lung]
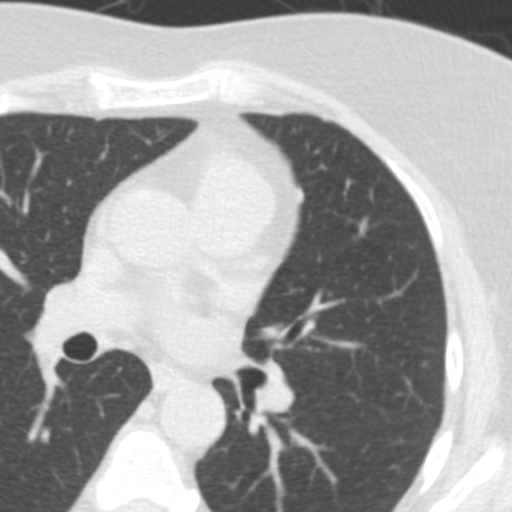
[im 81/91  vessel]
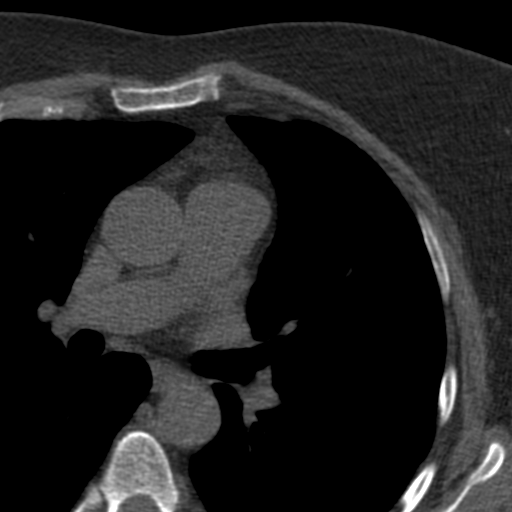
[im 86/91  vessel]
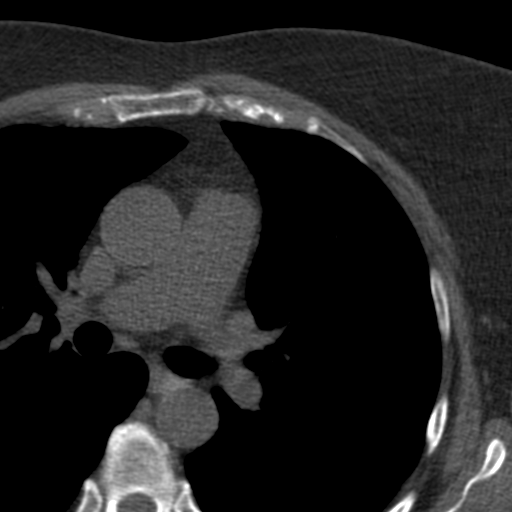

[15 of 20 positions shown; findings below may reference images not displayed]

Count of known CT and Cardiac Nuclear Medicine studies performed in the previous 
12 months = 0
FINDINGS: Visualized lungs are clear.
IMPRESSION: Left Main (LM) Score: 0 
Left Anterior Descending Artery (LAD) Score: 0 
Circumflex (CM) Score: 0 
Right Coronary Artery (RCA) Score: 0 
Posterior Descending Artery (PDA) Score: 0 
Other: 0
IMPRESSION: Total Calcium Score: 0 
No ancillary findings. 
If calcium score is greater than 100, and the patient is symptomatic with chest 
pain or shortness of breath, would recommend follow-up with coronary CTA with 
Cleerly plaque analysis. 
Calcium scoring sheets to follow. 
RADIATION DOSE REDUCTION: All CT scans are performed using radiation dose 
reduction techniques, when applicable.  Technical factors are evaluated and 
adjusted to ensure appropriate moderation of exposure.  Automated dose 
management technology is applied to adjust the radiation doses to minimize 
exposure while achieving diagnostic quality images. 
Consider further evaluation if multi-vessel or left-main predominant disease is 
present. 
Calcium score                                     Presence of Plaque 
0                                                    No evidence of plaque 
1-10                                               Minimal evidence of plaque 
11-100                                            Mild evidence of plaque 
101-400                                          Moderate evidence of plaque 
Over 400                                        Extensive evidence of plaque

## 2022-10-14 IMAGING — MG MAMMOGRAPHY SCREENING BILATERAL 3[PERSON_NAME]
8 series · 8 of 24 positions shown · non-contrast
Comparison: 09/11/2021 and exams dating back to 02/25/2016

________________________________________________________________________________________________ 
MAMMOGRAPHY SCREENING BILATERAL 3CASH WHISENHUNT, 10/14/2022 [DATE]: 
CLINICAL INDICATION: Encounter for screening mammogram.
TECHNIQUE: Digital bilateral mammograms and 3-D Tomosynthesis were obtained. 
These were interpreted both primarily and with the aid of computer-aided 
detection system.  
BREAST DENSITY: (Level B) There are scattered areas of fibroglandular density.

[L MLO]
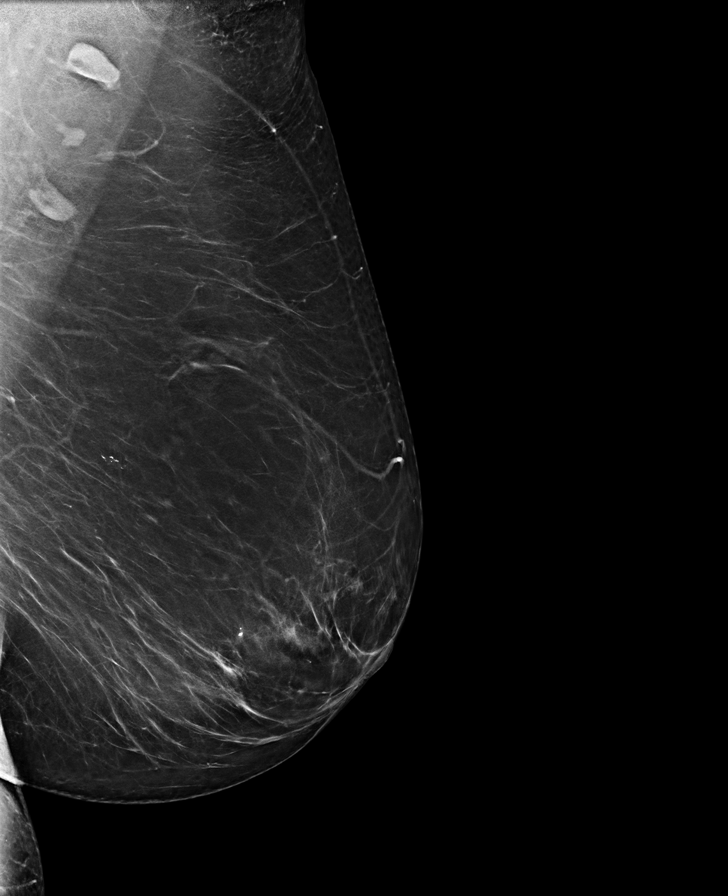

[L CC]
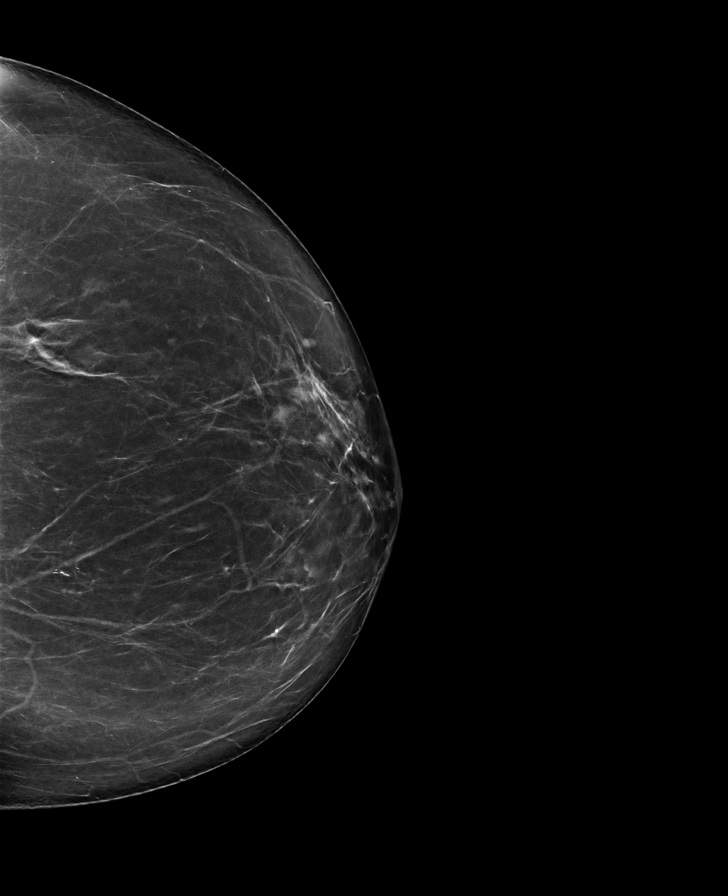

[R MLO]
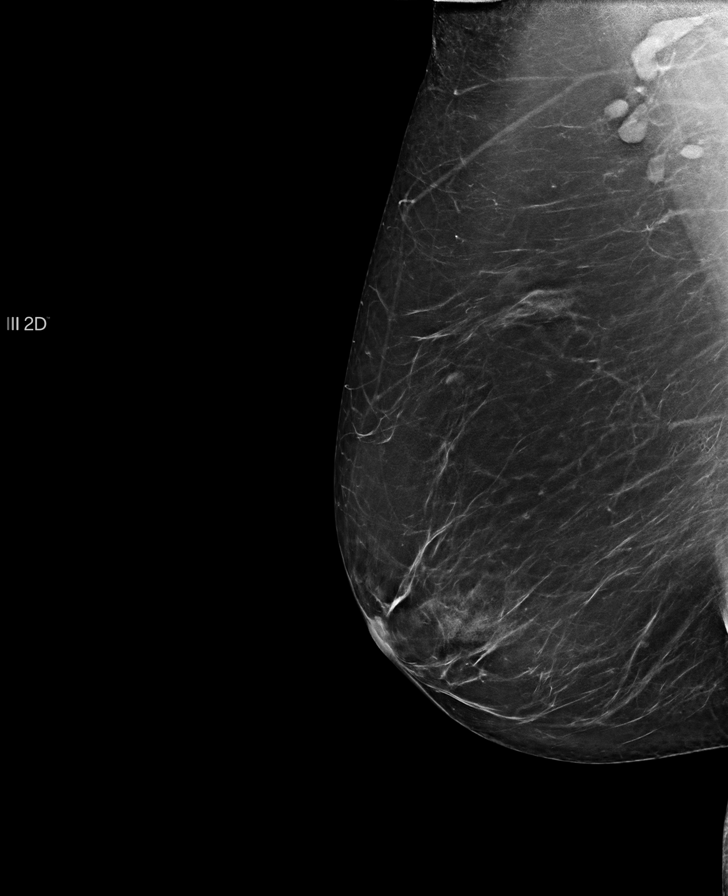

[R CC]
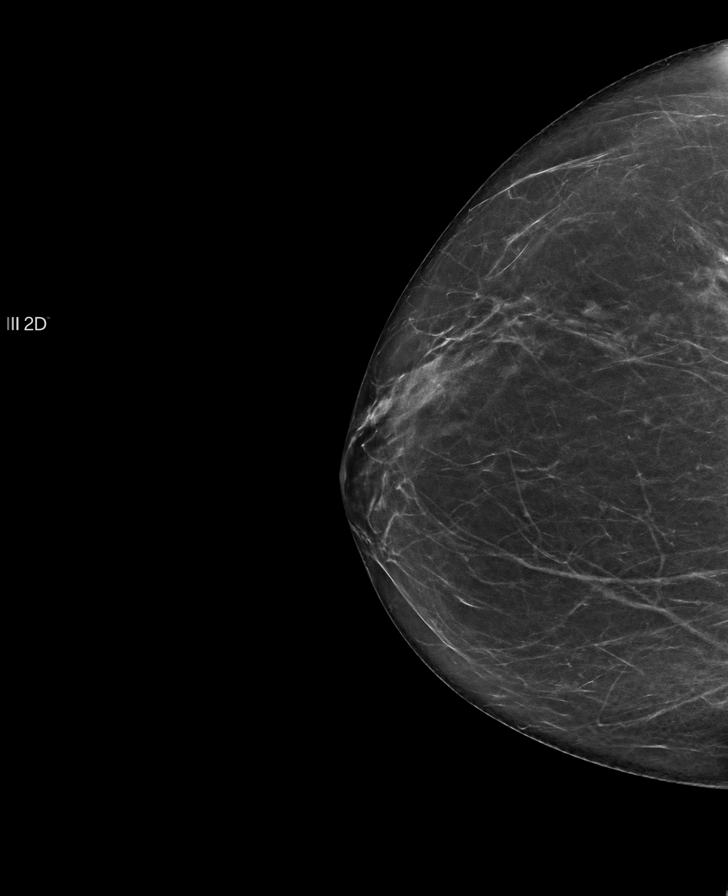

[L CC tomo · tomo slice 37/74.0]
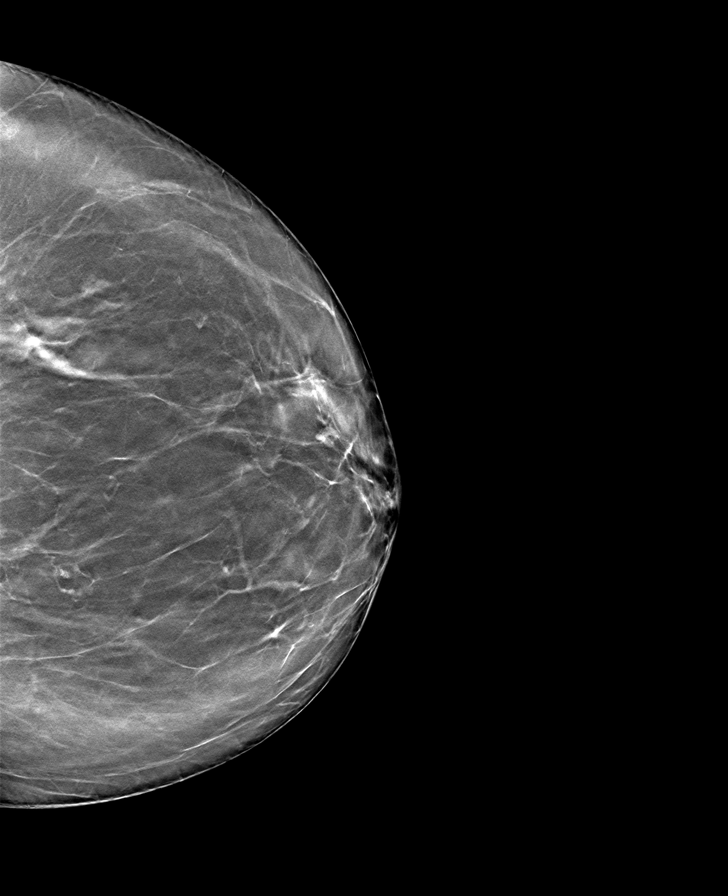

[R CC tomo · tomo slice 35/68.0]
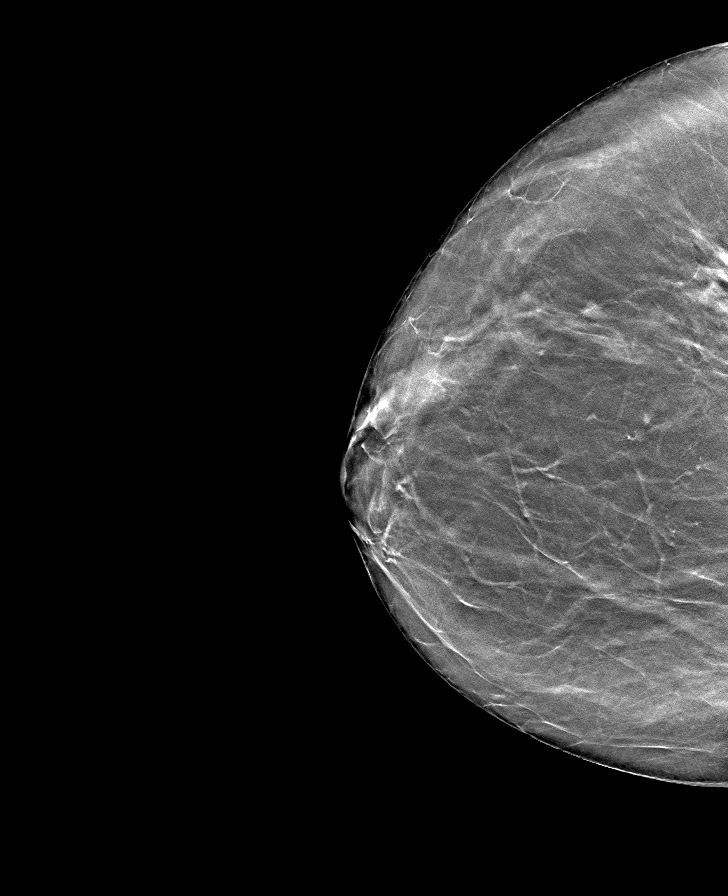

[R MLO tomo · tomo slice 43/86.0]
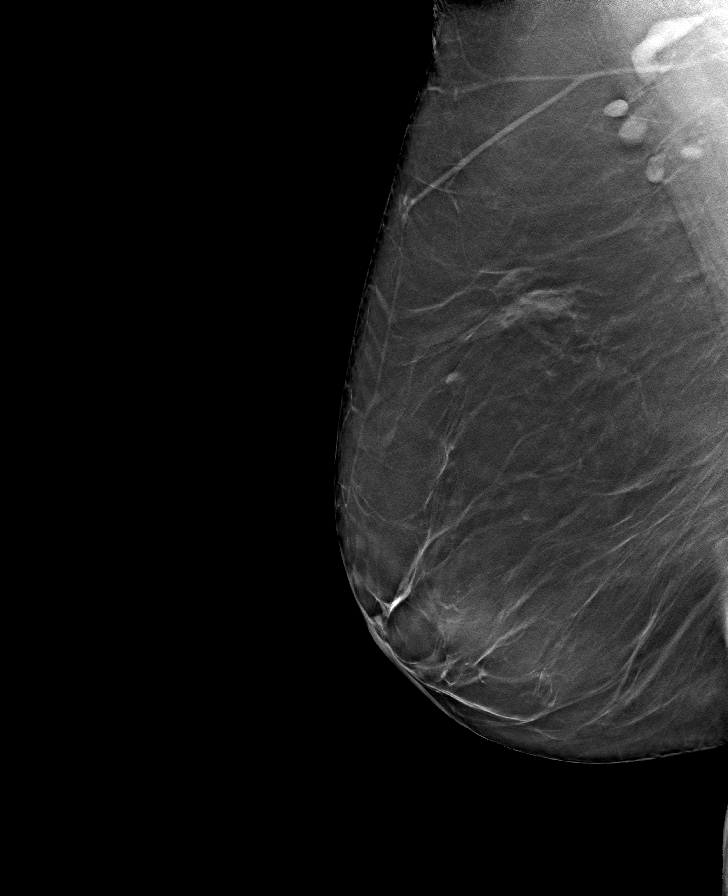

[L MLO tomo · tomo slice 47/93.0]
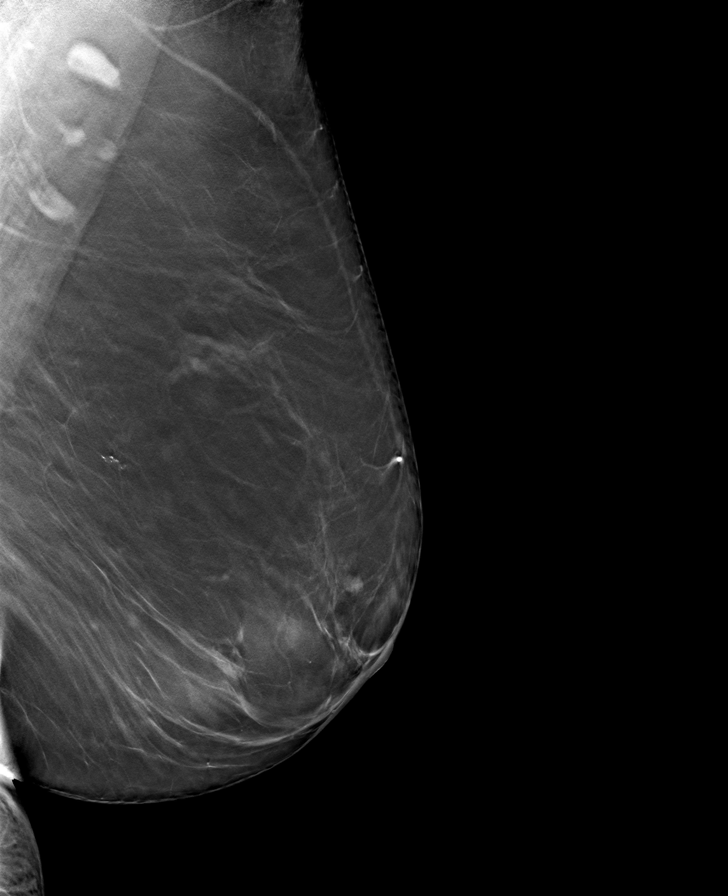

[8 of 24 positions shown; findings below may reference images not displayed]

FINDINGS: No suspicious mass, calcifications, or area of architectural 
distortion in either breast. Overall stable mammographic appearance.
IMPRESSION: No mammographic findings suggestive for malignancy. 
(BI-RADS 2) Benign findings. Routine mammographic follow-up is recommended.

## 2023-03-10 IMAGING — MR MRI SACRUM W/WO CONTRAST
4 of 10 series · 12 of 48 positions shown · IV contrast (gadavist)
Comparison: Delay in the report was to obtain prior exams for comparison.

________________________________________________________________________________________________ 
MRI SACRUM W/WO CONTRAST, 03/10/2023 [DATE]: 
CLINICAL INDICATION: Coccygectomy 7 months ago for osteomyelitis. Burning pain 
at surgical site. History of hysterectomy.
TECHNIQUE: Multiplanar, multiecho position MR images of the sacrum were 
performed without and with 8.5 mL of Gadavist injected intravenously by hand. 
1.5 mL of Gadavist discarded. Patient was scanned on a 1.5T magnet.

[Series 301: T1 · sagittal · 4.0mm · 0.40mm/px · 3 of 29 slices shown (1 of 3)]
[im 1/29]
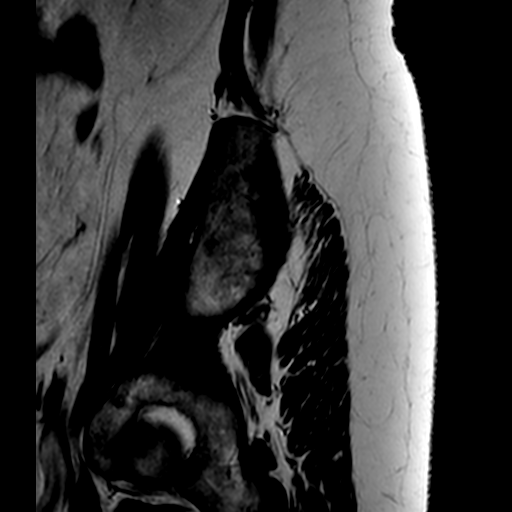
[im 19/29]
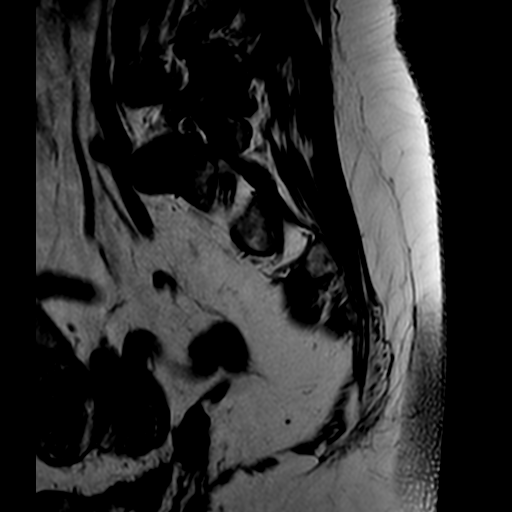
[im 29/29]
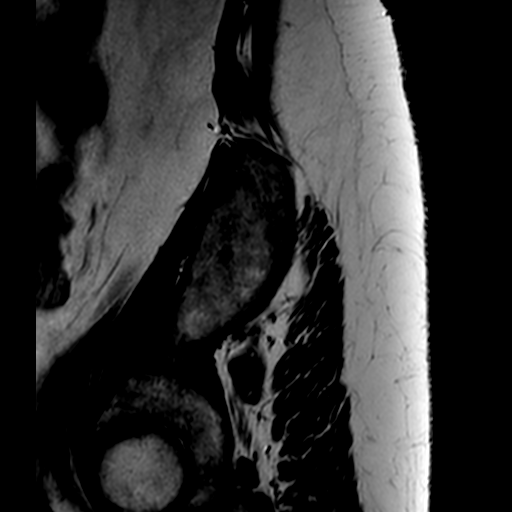

[Series 501: T1 · oblique · 3.0mm · 0.32mm/px · 3 of 28 slices shown (2 of 3)]
[im 1/28]
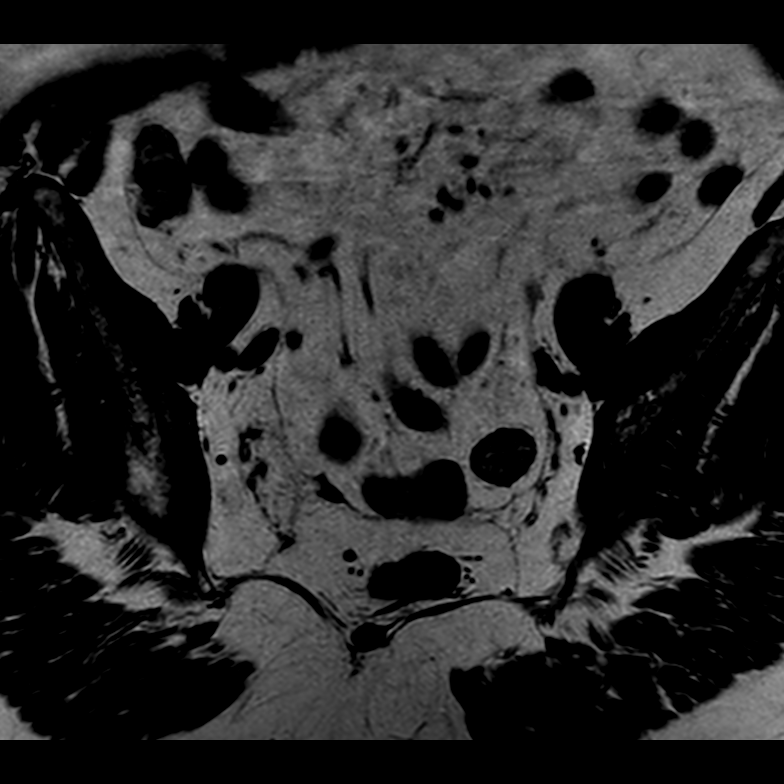
[im 14/28]
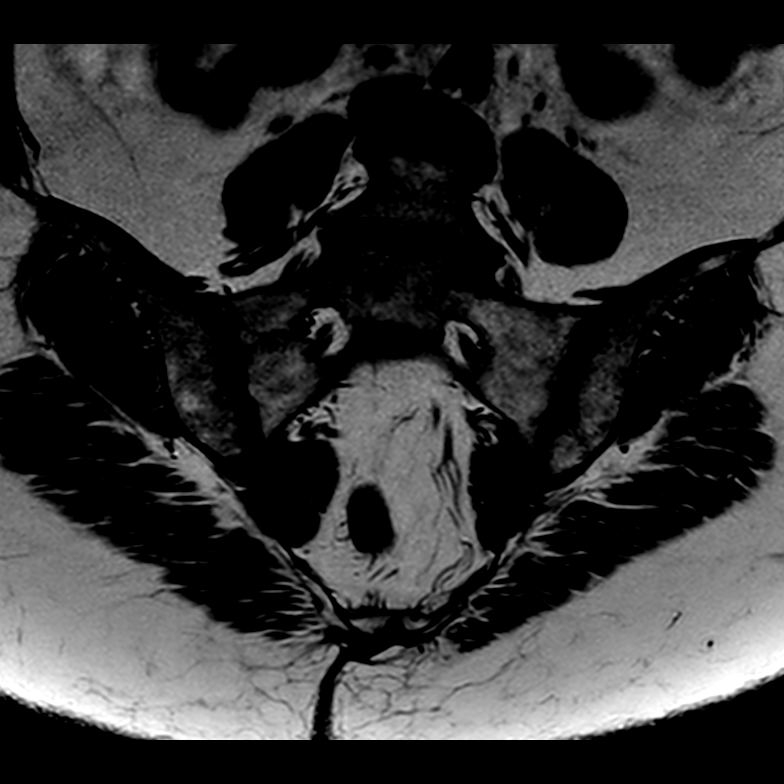
[im 28/28]
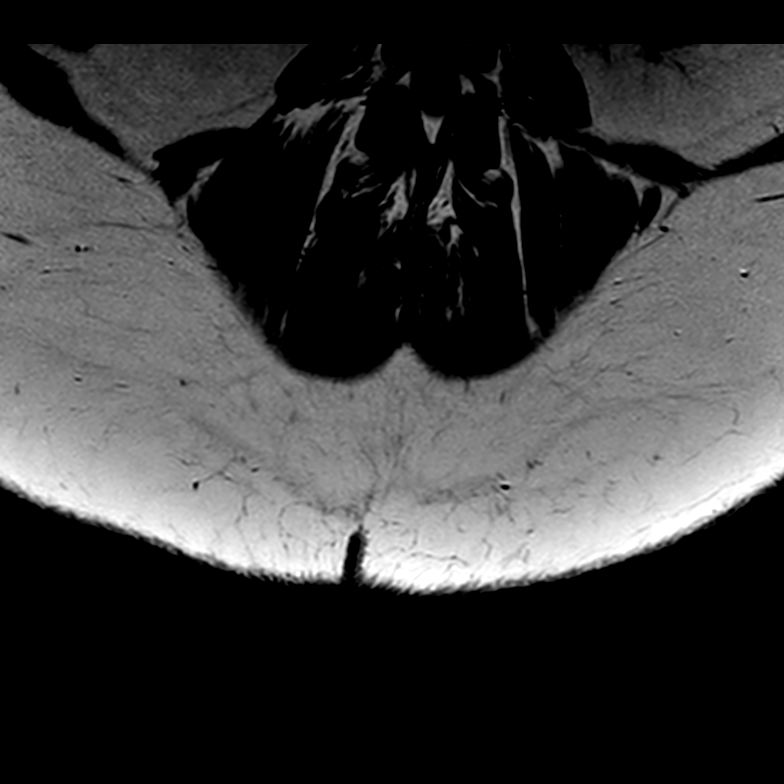

[Series 701: T1 · axial · 4.0mm · 0.36mm/px · z∈[-4,+133]mm · 3 of 36 slices shown (3 of 3)]
[im 8/36]
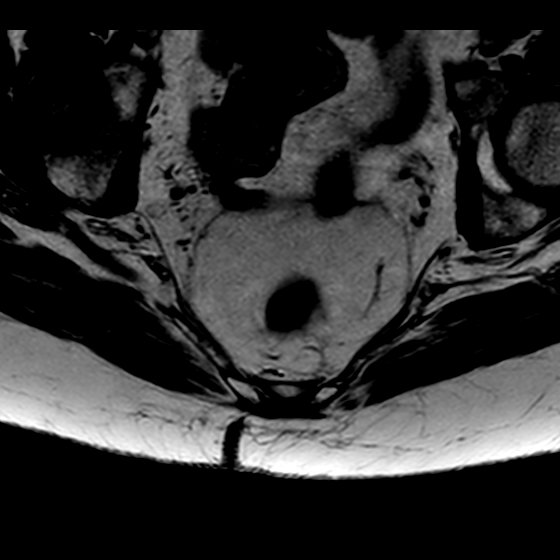
[im 22/36]
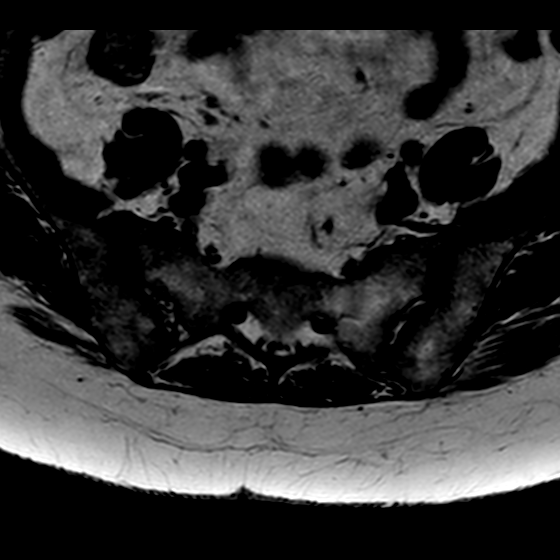
[im 36/36]
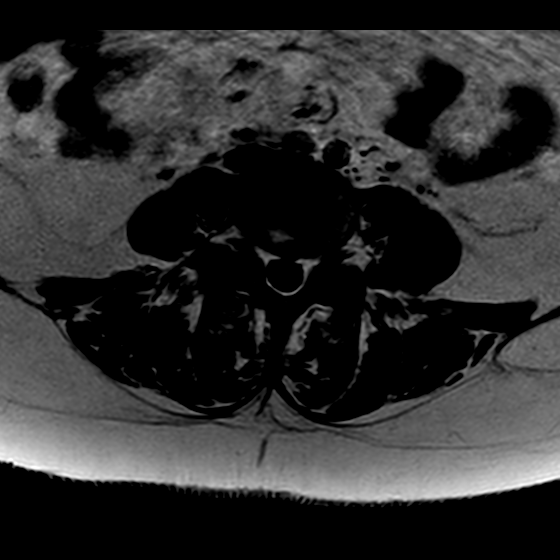

[Series 901: T1 fat-sat post-contrast · axial · 4.0mm · 0.28mm/px · z∈[-4,+133]mm · 3 of 36 slices shown]
[im 8/36]
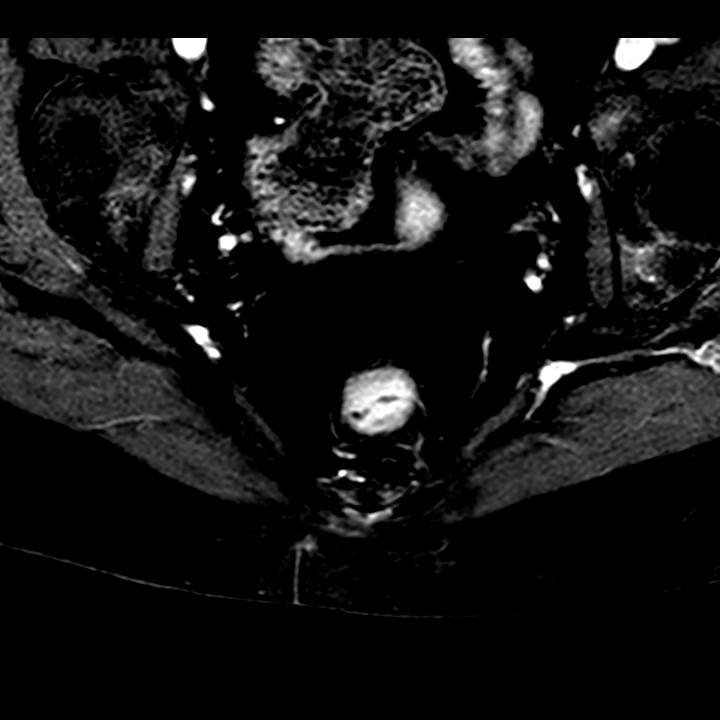
[im 22/36]
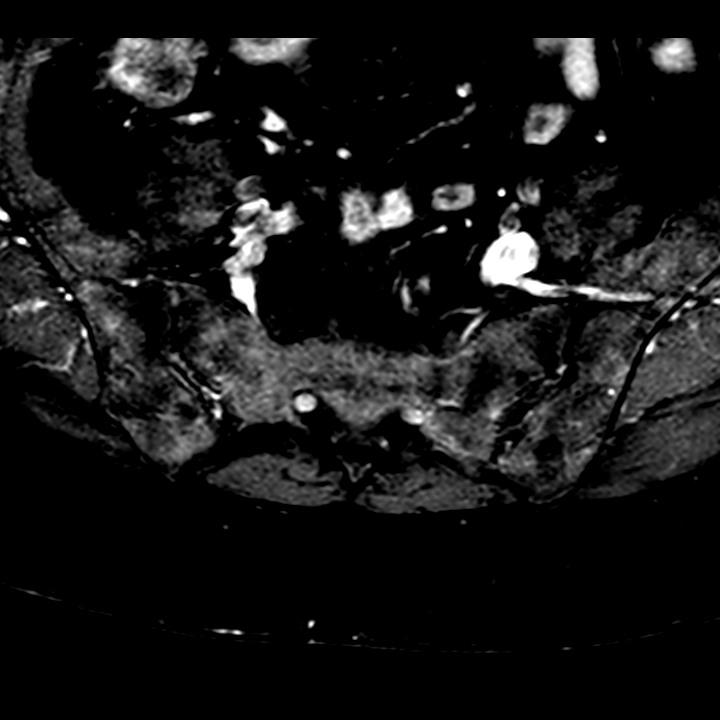
[im 36/36]
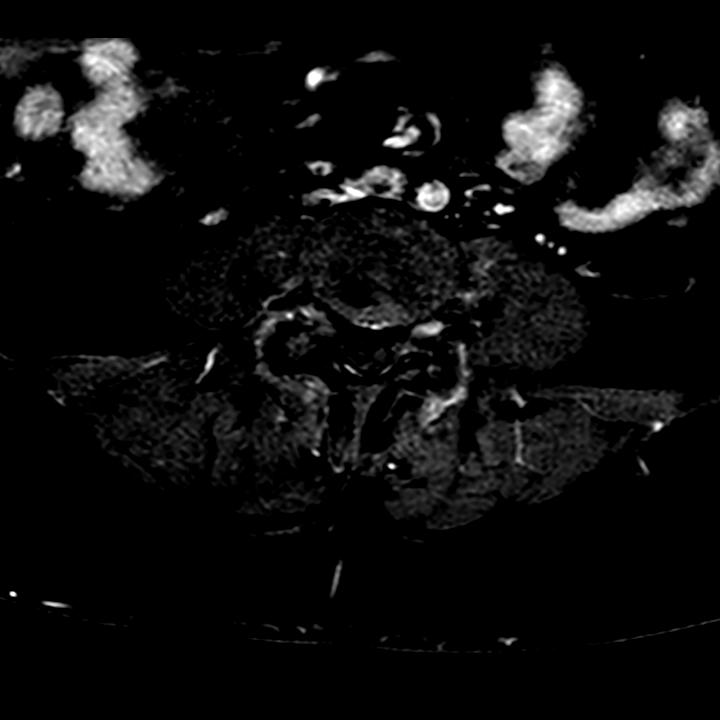

[12 of 48 positions shown; findings below may reference images not displayed]

Comparison is now made with MR examination of the sacrum on 09/02/2022. MR exam 
of the pelvis of 07/15/2022 Comparison is also made with exams dating back to CT 
of the abdomen pelvis of 05/15/2021.
FINDINGS: SACRUM: No fractures, marrow edema-like signal changes or marrow replacing 
lesions. 
SI JOINTS: Moderate degenerative change. 
ILIAC BONES: No fractures, marrow edema-like signal changes or marrow replacing 
lesions. 
LOWER LUMBAR SPINE: Multilevel degenerative change. 
ADDITIONAL FINDINGS: Again seen are postsurgical changes of partial 
coccygectomy, seen on the prior MR of the sacrum of 09/02/2022. There is been 
interval decrease in extent of fluid and enhancement within the surgical bed. A 
small amount of enhancement remains most suggestive for granulation tissue. 
There is a small amount of fluid signal intensity and enhancement distal to the 
remaining coccygeal tip, unchanged. No focal rim-enhancing fluid collection to 
suggest abscess. Normal marrow signal intensity of the remaining coccyx without 
findings to suggest stress response or osteomyelitis. Enhancement extends to the 
gluteal cleft without focal discrete fistulous tract.
IMPRESSION: Postoperative changes of partial coccygectomy. There has been temporal evolution 
with interval decrease in enhancement and fluid about the surgical bed with 
remaining soft tissue enhancement most suggestive for granulation tissue.

## 2023-03-25 IMAGING — MR MRI RIGHT FOOT WITHOUT CONTRAST
4 of 6 series · 14 of 40 positions shown · IV contrast (gadolinium)
Comparison: 06/01/2022 [facility]

________________________________________________________________________________________________ 
MRI RIGHT FOOT WITHOUT CONTRAST, 03/25/2023 [DATE]: 
CLINICAL INDICATION: Primary Osteoarthritis, Right Ankle And Foot
TECHNIQUE: Multiplanar, multiecho position MR images of the foot were performed 
without intravenous gadolinium enhancement. Patient was scanned on a 3T magnet

[Series 201: survey_mst · axial · 10.0mm · 0.78mm/px · z∈[-93,+171]mm · 2 of 15 slices shown]
[im 1/15]
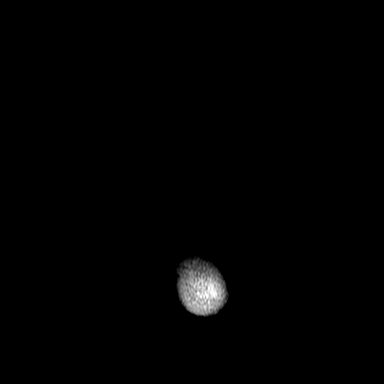
[im 15/15]
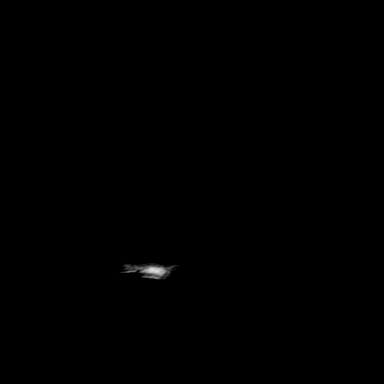

[Series 301: t1w_foot cs hr · sagittal · 2.5mm · 0.31mm/px · 3 of 32 slices shown]
[im 7/32]
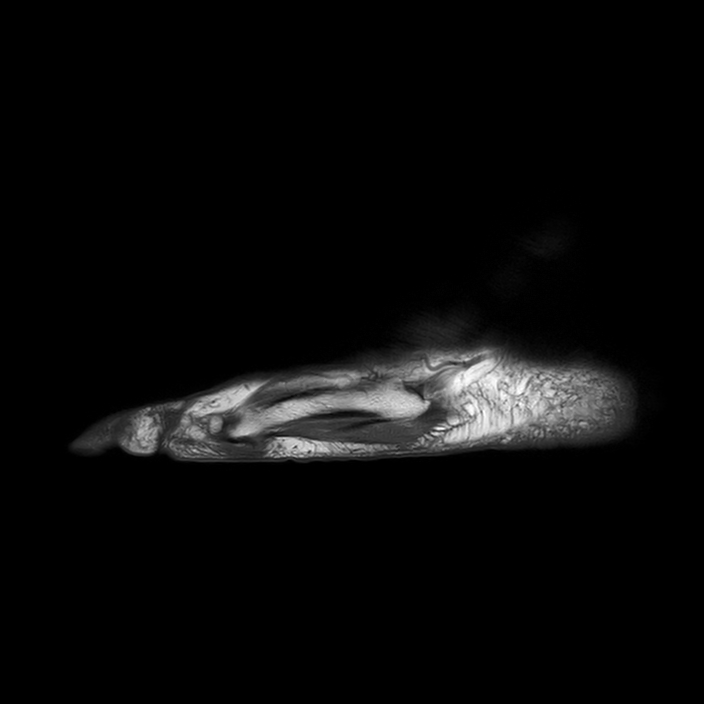
[im 19/32]
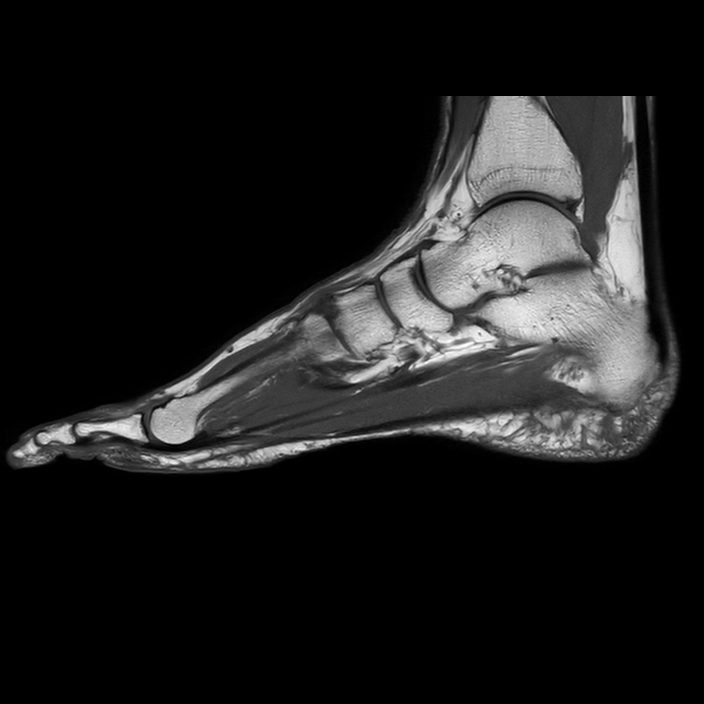
[im 32/32]
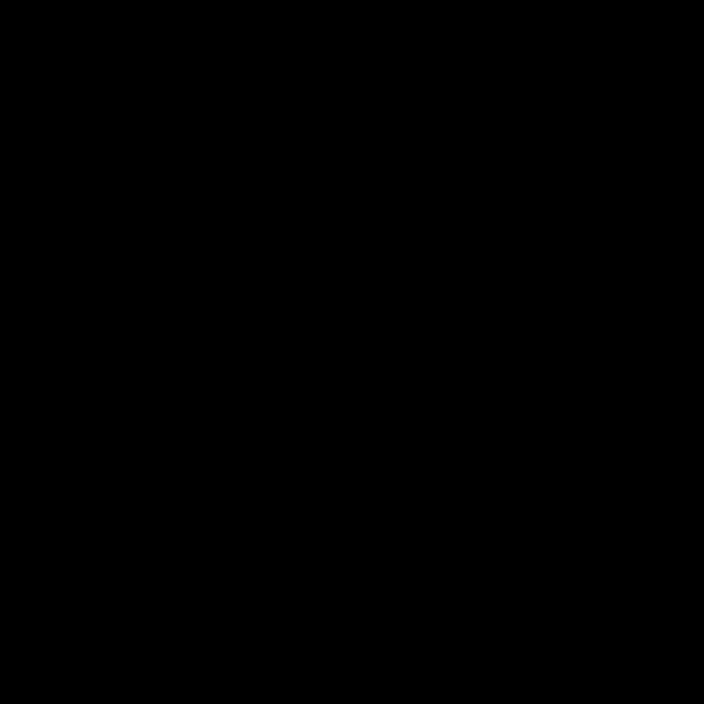

[Series 401: PD fat-sat · sagittal · 2.5mm · 0.33mm/px · 6 of 32 slices shown]
[im 1/32]
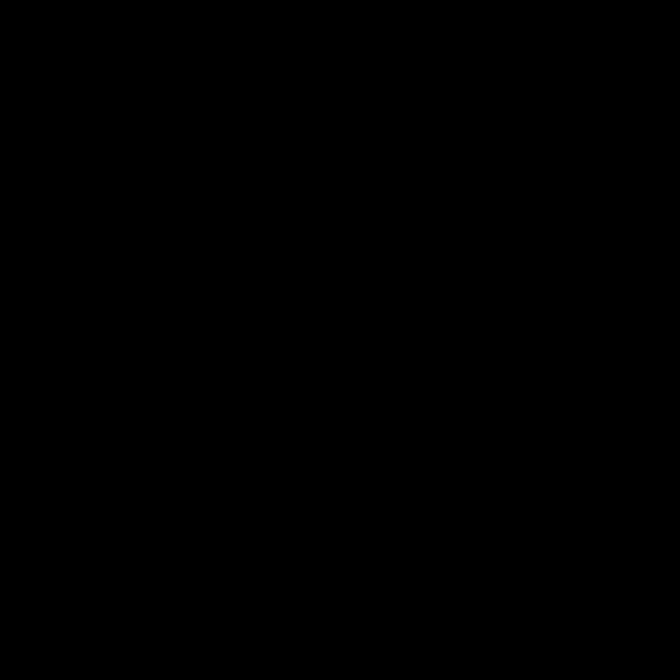
[im 7/32]
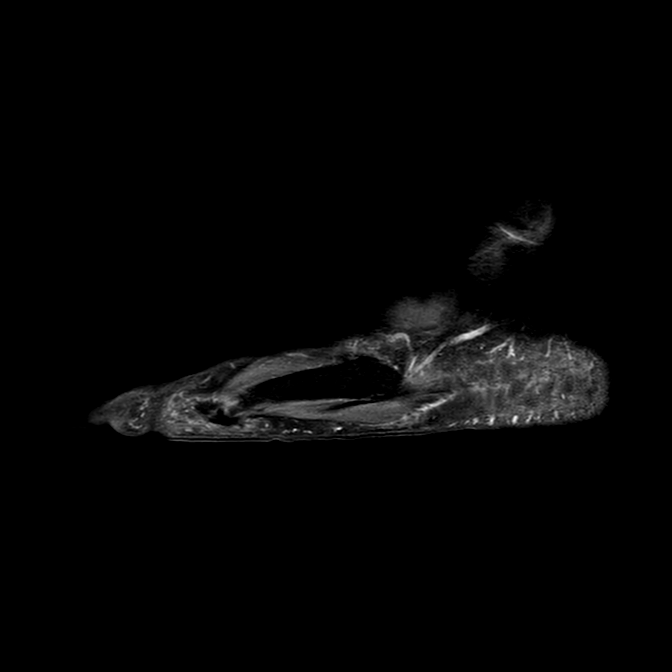
[im 13/32]
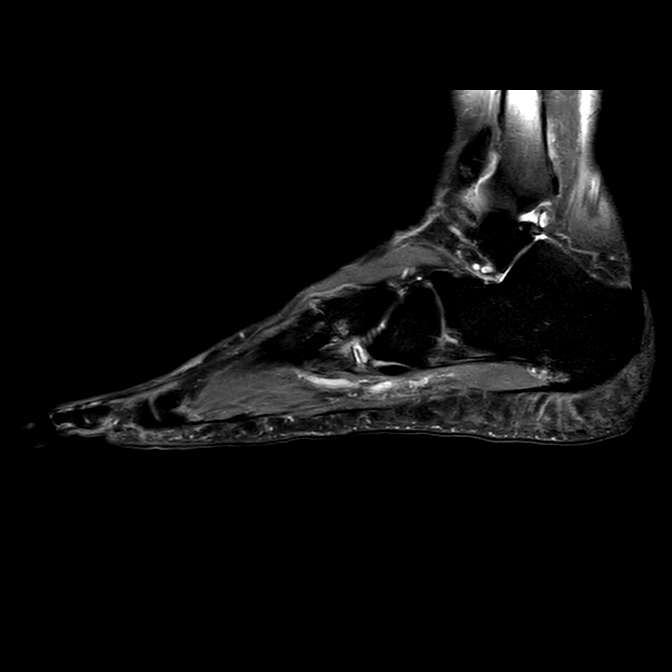
[im 19/32]
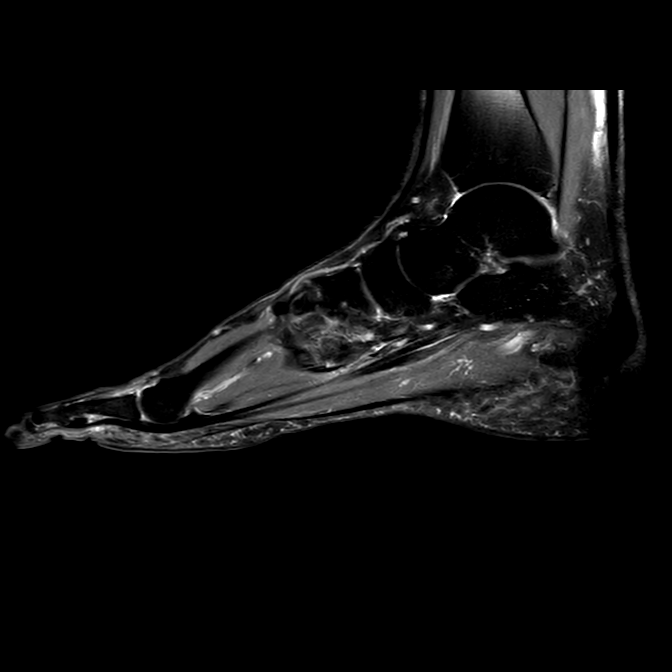
[im 25/32]
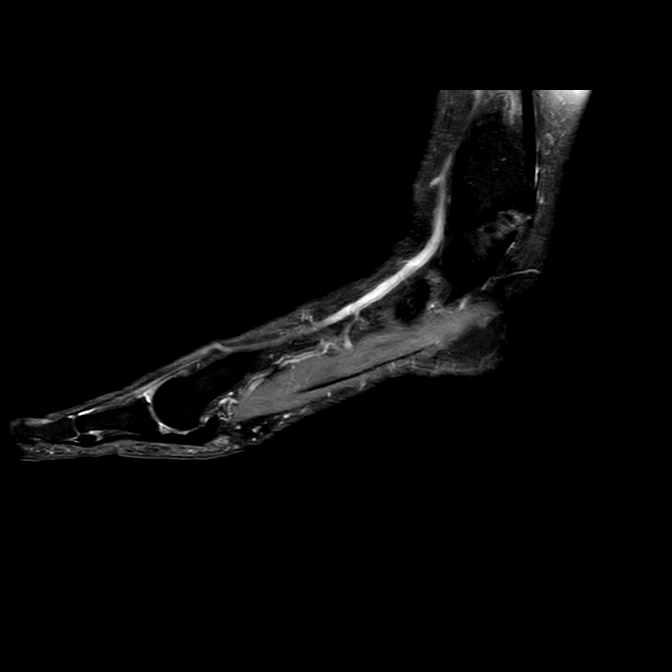
[im 32/32]
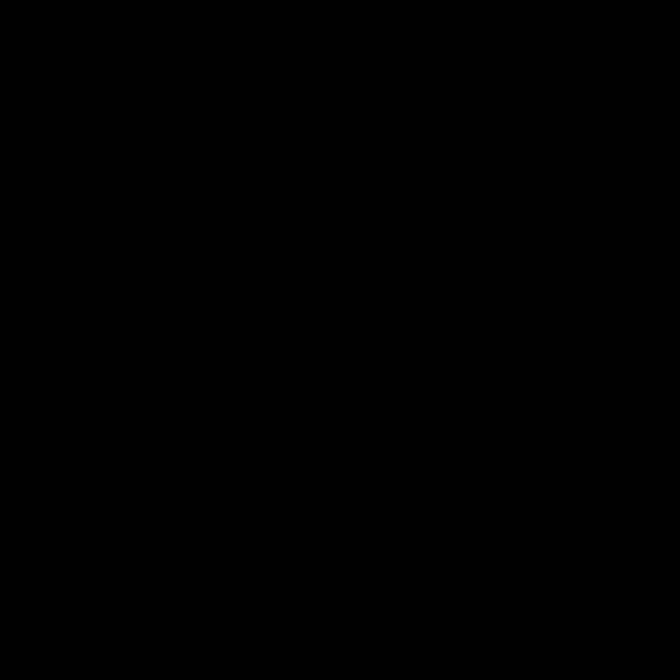

[Series 601: T1 · coronal · 3.0mm · 0.27mm/px · 3 of 56 slices shown]
[im 7/56]
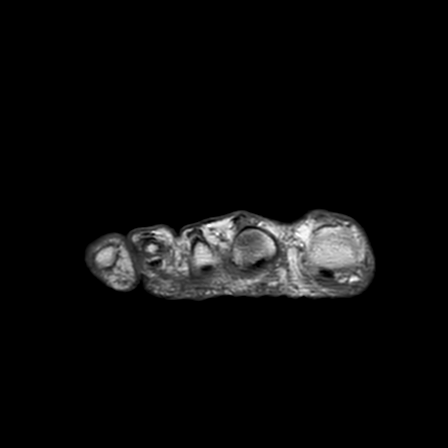
[im 31/56]
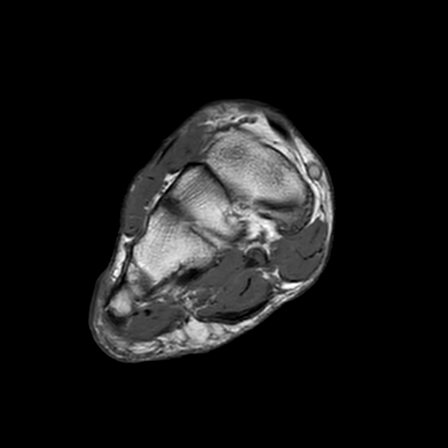
[im 49/56]
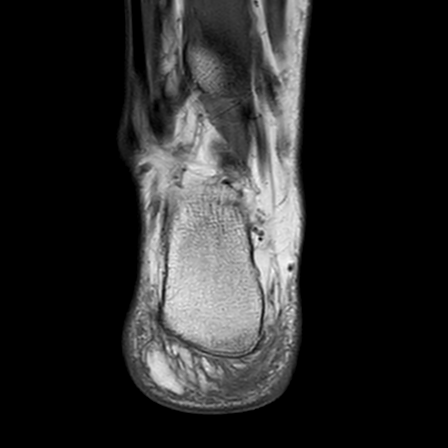

[14 of 40 positions shown; findings below may reference images not displayed]

FINDINGS: TENDONS: The flexor, extensor and peroneal tendons are intact. No tendon tear, 
tenosynovitis or tendinopathy. No tendon subluxation. The Achilles tendon is 
preserved. 
LIGAMENTS:  
LATERAL LIGAMENTS: The anterior talofibular ligament is intact. The 
calcaneofibular ligament and posterior talofibular ligament are preserved. 
SYNDESMOTIC LIGAMENTS: The anterior and posterior tibiofibular and interosseous 
ligaments are preserved. 
DELTOID LIGAMENTOUS COMPLEX: The deep and superficial components of the deltoid 
ligamentous complex are intact. 0.4 cm ossified body along the deltoid ligament. 
SINUS TARSI LIGAMENTS: The cervical and interosseous ligaments are preserved. 
The inferior extensor retinaculum appears intact.  
BONES AND JOINTS: Normal bone marrow signal intensity. No fracture, contusion or 
stress response. The talar dome is preserved. No osteochondral lesion. Ankle and 
subtalar joints are preserved. Small calcaneal enthesophytes. Two ossa 
naviculare. Stable moderate third and mild second/fourth tarsometatarsal (TMT) 
joint degenerative change. Mild degenerative change of the talonavicular and 
hallux metatarsal-phalangeal (MTP) joints. The interphalangeal (IP) joints are 
preserved. 
MUSCLES: Musculature is symmetric without mass, signal abnormality or atrophy.  
OTHER SOFT TISSUES: Induration of the plantar fascia heel pad. Tarsal tunnel is 
preserved. Sinus tarsi fat is preserved. Plantar fascia is intact. No 
ulceration. No abscess.
IMPRESSION: 1.  Moderate third and mild second/fourth TMT joint and mild 
talonavicular/hallux MTP joint degenerative change. 
2.  Small calcaneal enthesophytes.  
3.  Induration of the plantar fascia heel pad.
